# Patient Record
Sex: Female | Born: 1935 | Race: White | Hispanic: No | Marital: Single | Smoking: Never smoker
Health system: Southern US, Community
[De-identification: ages and names within clinical notes are randomized; demographics above are authoritative.]

## PROBLEM LIST (undated history)

## (undated) DIAGNOSIS — R778 Other specified abnormalities of plasma proteins: Secondary | ICD-10-CM

## (undated) DIAGNOSIS — I480 Paroxysmal atrial fibrillation: Secondary | ICD-10-CM

## (undated) DIAGNOSIS — T4145XA Adverse effect of unspecified anesthetic, initial encounter: Secondary | ICD-10-CM

## (undated) DIAGNOSIS — R7989 Other specified abnormal findings of blood chemistry: Secondary | ICD-10-CM

## (undated) DIAGNOSIS — E785 Hyperlipidemia, unspecified: Secondary | ICD-10-CM

## (undated) DIAGNOSIS — I10 Essential (primary) hypertension: Secondary | ICD-10-CM

## (undated) DIAGNOSIS — I639 Cerebral infarction, unspecified: Secondary | ICD-10-CM

## (undated) DIAGNOSIS — K5792 Diverticulitis of intestine, part unspecified, without perforation or abscess without bleeding: Secondary | ICD-10-CM

## (undated) DIAGNOSIS — I4892 Unspecified atrial flutter: Secondary | ICD-10-CM

## (undated) DIAGNOSIS — I471 Supraventricular tachycardia: Secondary | ICD-10-CM

## (undated) DIAGNOSIS — I4719 Other supraventricular tachycardia: Secondary | ICD-10-CM

## (undated) DIAGNOSIS — R739 Hyperglycemia, unspecified: Secondary | ICD-10-CM

## (undated) DIAGNOSIS — A8100 Creutzfeldt-Jakob disease, unspecified: Secondary | ICD-10-CM

## (undated) DIAGNOSIS — I2510 Atherosclerotic heart disease of native coronary artery without angina pectoris: Secondary | ICD-10-CM

## (undated) DIAGNOSIS — I959 Hypotension, unspecified: Secondary | ICD-10-CM

## (undated) DIAGNOSIS — T8859XA Other complications of anesthesia, initial encounter: Secondary | ICD-10-CM

## (undated) DIAGNOSIS — E669 Obesity, unspecified: Secondary | ICD-10-CM

## (undated) DIAGNOSIS — E039 Hypothyroidism, unspecified: Secondary | ICD-10-CM

## (undated) DIAGNOSIS — R001 Bradycardia, unspecified: Secondary | ICD-10-CM

## (undated) HISTORY — PX: ROTATOR CUFF REPAIR: SHX139

## (undated) HISTORY — DX: Essential (primary) hypertension: I10

## (undated) HISTORY — DX: Atherosclerotic heart disease of native coronary artery without angina pectoris: I25.10

## (undated) HISTORY — PX: KNEE ARTHROSCOPY: SUR90

## (undated) HISTORY — DX: Cerebral infarction, unspecified: I63.9

## (undated) HISTORY — DX: Hyperlipidemia, unspecified: E78.5

## (undated) HISTORY — PX: BREAST CYST ASPIRATION: SHX578

## (undated) HISTORY — DX: Hypothyroidism, unspecified: E03.9

## (undated) HISTORY — DX: Bradycardia, unspecified: R00.1

## (undated) HISTORY — DX: Paroxysmal atrial fibrillation: I48.0

## (undated) HISTORY — PX: GANGLION CYST EXCISION: SHX1691

---

## 2004-01-15 ENCOUNTER — Ambulatory Visit: Payer: Self-pay | Admitting: Specialist

## 2005-09-08 ENCOUNTER — Ambulatory Visit: Payer: Self-pay | Admitting: Unknown Physician Specialty

## 2005-12-01 ENCOUNTER — Ambulatory Visit: Payer: Self-pay

## 2007-09-19 ENCOUNTER — Ambulatory Visit: Payer: Self-pay | Admitting: Internal Medicine

## 2008-01-15 ENCOUNTER — Inpatient Hospital Stay: Payer: Self-pay | Admitting: Internal Medicine

## 2008-01-23 ENCOUNTER — Ambulatory Visit: Payer: Self-pay | Admitting: Specialist

## 2008-09-01 DIAGNOSIS — I639 Cerebral infarction, unspecified: Secondary | ICD-10-CM

## 2008-09-01 HISTORY — DX: Cerebral infarction, unspecified: I63.9

## 2008-09-16 ENCOUNTER — Inpatient Hospital Stay: Payer: Self-pay | Admitting: Internal Medicine

## 2008-11-04 ENCOUNTER — Ambulatory Visit: Payer: Self-pay

## 2009-11-25 ENCOUNTER — Ambulatory Visit: Payer: Self-pay

## 2011-01-04 ENCOUNTER — Ambulatory Visit: Payer: Self-pay

## 2012-01-11 ENCOUNTER — Ambulatory Visit: Payer: Self-pay

## 2012-12-24 ENCOUNTER — Ambulatory Visit: Payer: Self-pay | Admitting: Specialist

## 2013-01-16 ENCOUNTER — Ambulatory Visit: Payer: Self-pay

## 2014-01-19 ENCOUNTER — Ambulatory Visit: Payer: Self-pay | Admitting: Family Medicine

## 2014-04-03 HISTORY — PX: ABLATION: SHX5711

## 2014-07-01 ENCOUNTER — Encounter: Payer: Self-pay | Admitting: Internal Medicine

## 2014-07-29 ENCOUNTER — Encounter: Payer: Self-pay | Admitting: *Deleted

## 2014-07-29 ENCOUNTER — Ambulatory Visit (INDEPENDENT_AMBULATORY_CARE_PROVIDER_SITE_OTHER): Payer: Medicare Other | Admitting: Internal Medicine

## 2014-07-29 ENCOUNTER — Encounter: Payer: Self-pay | Admitting: Internal Medicine

## 2014-07-29 VITALS — BP 176/80 | HR 52 | Ht 65.0 in | Wt 183.0 lb

## 2014-07-29 DIAGNOSIS — I480 Paroxysmal atrial fibrillation: Secondary | ICD-10-CM | POA: Insufficient documentation

## 2014-07-29 DIAGNOSIS — I10 Essential (primary) hypertension: Secondary | ICD-10-CM | POA: Diagnosis not present

## 2014-07-29 MED ORDER — LOSARTAN POTASSIUM 25 MG PO TABS: 25.0000 mg | ORAL_TABLET | Freq: Every day | ORAL | Status: DC

## 2014-07-29 NOTE — Progress Notes (Signed)
 Electrophysiology Office Note   Date:  07/29/2014   ID:  Rhonda Graham, DOB 09/22/1934, MRN [id number]  PCP:  BABAOFF, [name], MD  Cardiologist:  Dr Paraschos Primary Electrophysiologist: Ersie Savino, MD   Referring:  Dr K Thomas ([facility])  Chief Complaint  Patient presents with  . Appointment    afib     History of Present Illness: Rhonda Graham is a 80 y.o. female who presents today for electrophysiology evaluation.   She reports initially being diagnosed with atrial fibrillation in 1999 after presenting with tachypalpitations.   She had a stroke in 2010 and continues to have residual gait imbalance.  She was placed on amiodarone which she did not tolerate due to thyroid abnormality.  She was then placed on propafenone which was initially effective.  She has since had increasing frequency and duration of atrial fibrillation.  Presently, he reports having increased frequency of afib recently.  Over the past few months, she has had 13 episodes of afib.  During her afib, she reports symptoms of palpitations with associated SOB and fatigue.  She has mild dizziness.  Episodes typically last several minutes to hours at a time.  She feels that "stress" will cause her to have afib.  She has a bipolar daughter which causes her to have stress and afib. She occasionally has palpitations in her "throat".  Today, she denies symptoms of   chest pain,  orthopnea, PND, lower extremity edema, claudication,  presyncope, syncope, bleeding, or neurologic sequela. The patient is tolerating medications without difficulties and is otherwise without complaint today.    Past Medical History  Diagnosis Date  . Paroxysmal atrial fibrillation   . Sinus bradycardia   . Hypothyroid   . Coronary artery disease     prior cath without blockages per pt  . Hyperlipidemia   . Hypertension   . Stroke 09/2008    residual balance deficit  . Overweight    Past Surgical History  Procedure Laterality Date  .  Rotator cuff repair    . Ganglion cyst excision    . Knee arthroscopy       Current Outpatient Prescriptions  Medication Sig Dispense Refill  . Calcium Carbonate-Vitamin D 600-125 MG-UNIT TABS Take 1 tablet by mouth daily.     . Cholecalciferol (VITAMIN D3) 2000 UNITS TABS Take 2,000 Int'l Units by mouth daily.    . Ibuprofen 200 MG CAPS Take 800 mg by mouth as needed (pain).     . levothyroxine (SYNTHROID, LEVOTHROID) 125 MCG tablet Take 125 mcg by mouth daily before breakfast.    . metoprolol succinate (TOPROL-XL) 25 MG 24 hr tablet Take 12.5 mg by mouth daily.    . Omega-3 Fatty Acids (OMEGA-3 FISH OIL) 300 MG CAPS Take 1 tablet by mouth daily.     . Prenat w/o A-FE-Methf-FA-Omega (PNV-OMEGA) 28-0.6-0.4-340 MG CAPS Take 1 tablet by mouth daily.     . propafenone (RYTHMOL) 225 MG tablet Take 225 mg by mouth every 8 (eight) hours.    . simvastatin (ZOCOR) 20 MG tablet Take 20 mg by mouth daily.    . warfarin (COUMADIN) 2.5 MG tablet Take 2.5 mg by mouth daily.     No current facility-administered medications for this visit.    Allergies:   Review of patient's allergies indicates no known allergies.   Social History:  The patient  reports that she has never smoked. She does not have any smokeless tobacco history on file. She reports that she   does not drink alcohol or use illicit drugs.   Family History:  The patient's  family history includes Heart disease in her father and mother; Parkinsonism in her brother; Seizures in her sister; Stroke in her father and mother.    ROS:  Please see the history of present illness.   All other systems are reviewed and negative.    PHYSICAL EXAM: VS:  BP 176/80 mmHg  Pulse 52  Ht 5' 5" (1.651 m)  Wt 183 lb (83.008 kg)  BMI 30.45 kg/m2 , BMI Body mass index is 30.45 kg/(m^2). GEN: Well nourished, well developed, in no acute distress HEENT: normal Neck: no JVD, carotid bruits, or masses Cardiac: RRR; no murmurs, rubs, or gallops,no edema    Respiratory:  clear to auscultation bilaterally, normal work of breathing GI: soft, nontender, nondistended, + BS MS: no deformity or atrophy Skin: warm and dry  Neuro:  Strength and sensation are intact Psych: euthymic mood, full affect  EKG:  EKG is ordered today. The ekg ordered today shows sinus bradycadia 52 bpm, PR 174, QRS 84, Qtc 416, poor R wave progression   Recent Labs: No results found for requested labs within last 365 days.    Lipid Panel  No results found for: CHOL, TRIG, HDL, CHOLHDL, VLDL, LDLCALC, LDLDIRECT   Wt Readings from Last 3 Encounters:  07/29/14 183 lb (83.008 kg)      Other studies Reviewed: Additional studies/ records that were reviewed today include: Dr Thomas' notes from [facility] are reviewed , Dr Paraschos notes are reviewed, prior event monitor is reviewed (65 pages are reviewed), event monitor reveals pacs, nonsustained afib/ atach Review of the above records today demonstrates: Echo 4/16- LA 39mm, EF >55%    ASSESSMENT AND PLAN:  1.  Paroxysmal atrial fibrillation The patient has symptomatic atrial fibrillation.  She has atach/ nonsustained afib on her event monitor.  She also describes a more regular tachycardia previously which could have also been an SVT or atrial flutter.  She has failed medical therapy with rhythmol and amiodarone.  Therapeutic strategies for her atrial arrhythmias including medicine and ablation were discussed in detail with the patient today. Risk, benefits, and alternatives to EP study and radiofrequency ablation for afib were also discussed in detail today. These risks include but are not limited to stroke, bleeding, vascular damage, tamponade, perforation, damage to the esophagus, lungs, and other structures, AV block requiring PPM, pulmonary vein stenosis, worsening renal function, and death. The patient understands these risk and wishes to proceed.  We will therefore proceed with catheter ablation at the next available  time.  No changes are made today. Tikosyn and flecainide could be considered as alternatives to ablation.  2. Sinus bradycardia No indication for pacing at this time.  Will follow clinically  3. htn Above goal Add losartan  4. Obesity I have reviewed the patients BMI and decreased success rates with ablation at length today.  Weight loss is strongly advised.   Regular exercise is also encouraged as per CardioFit trial data. Per Guijian et al (PACE 2013; 36: 748-756), patients with BMI 25-29.9 (obese) have a 27% increase in AF recurrence post ablation.  Patients with BMI >30 have a 31% increase in AF recurrence post ablation when compared to those with BMI <25.  Current medicines are reviewed at length with the patient today.   The patient does not have concerns regarding her medicines.  The following changes were made today:  none  Signed, Tomekia Helton, MD  07/29/2014   3:36 PM     [facility] [address] [place] [address] [phone] (office) [phone] (fax)  

## 2014-07-29 NOTE — Patient Instructions (Signed)
Medication Instructions:  Your physician has recommended you make the following change in your medication:  1) Start Losartan 25 mg daily   Labwork: Your physician recommends that you return for lab work on 08/14/14 at 10am  You do not have to be fasting  Will need to have your INR checked and faxed to Dr Johney FrameAllred next week with Dr Jasmine DecemberParascho's   Fax to [phone]   Testing/Procedures: Your physician has recommended that you have an ablation. Catheter ablation is a medical procedure used to treat some cardiac arrhythmias (irregular heartbeats). During catheter ablation, a long, thin, flexible tube is put into a blood vessel in your groin (upper thigh), or neck. This tube is called an ablation catheter. It is then guided to your heart through the blood vessel. Radio frequency waves destroy small areas of heart tissue where abnormal heartbeats may cause an arrhythmia to start. Please see the instruction sheet given to you today.  See instruction sheet for procedure      Follow-Up: Will schedule follow up at the hospital after procedure  Any Other Special Instructions Will Be Listed Below (If Applicable).

## 2014-07-30 ENCOUNTER — Other Ambulatory Visit: Payer: Self-pay | Admitting: *Deleted

## 2014-07-30 ENCOUNTER — Telehealth: Payer: Self-pay | Admitting: *Deleted

## 2014-07-30 DIAGNOSIS — I480 Paroxysmal atrial fibrillation: Secondary | ICD-10-CM

## 2014-07-30 LAB — PROTIME-INR

## 2014-07-30 NOTE — Telephone Encounter (Signed)
Called Dr. Jasmine DecemberParascho's office to inform patient needs INR checked this week and next week. They will fax results to Dr. Johney FrameAllred. Patient aware.  She will go today and next Thursday for INR check.  She will come to this office on 5/13 for further blood work.  She would like to inform that her BP last night was 109/55, HR 57 by her automatic BP cuff. She picked up her losartan this am and has concerns about taking. Advised pt to check BP prior to meds and about 3 hours later to monitor effects of losartan.

## 2014-08-03 ENCOUNTER — Telehealth: Payer: Self-pay | Admitting: Internal Medicine

## 2014-08-03 NOTE — Telephone Encounter (Signed)
New Message  Pt wanted to speak w/ Rn about TEE. Please call back and dsicuss.

## 2014-08-03 NOTE — Telephone Encounter (Signed)
Patient was concerned about how long the TEE test will take. Informed patient that it all depends, but told her maybe around an hour for the test and to include prep time and recovery time before and after. Informed patient to make sure she had someone to drive her home. Patient verbalized understanding.

## 2014-08-06 ENCOUNTER — Encounter: Payer: Self-pay | Admitting: Internal Medicine

## 2014-08-06 LAB — PROTIME-INR: INR: 3.5 — AB (ref 0.9–1.1)

## 2014-08-14 ENCOUNTER — Other Ambulatory Visit (INDEPENDENT_AMBULATORY_CARE_PROVIDER_SITE_OTHER): Payer: Medicare Other | Admitting: *Deleted

## 2014-08-14 DIAGNOSIS — I480 Paroxysmal atrial fibrillation: Secondary | ICD-10-CM | POA: Diagnosis not present

## 2014-08-14 LAB — BASIC METABOLIC PANEL
BUN: 14 mg/dL (ref 6–23)
CALCIUM: 9.2 mg/dL (ref 8.4–10.5)
CHLORIDE: 103 meq/L (ref 96–112)
CO2: 29 meq/L (ref 19–32)
Creatinine, Ser: 0.91 mg/dL (ref 0.40–1.20)
GFR: 63.54 mL/min (ref >60.00)
GLUCOSE: 112 mg/dL — AB (ref 70–99)
POTASSIUM: 3.9 meq/L (ref 3.5–5.1)
Sodium: 137 mEq/L (ref 135–145)

## 2014-08-14 LAB — CBC WITH DIFFERENTIAL/PLATELET
Basophils Absolute: 0.1 10*3/uL (ref 0.0–0.1)
Basophils Relative: 0.8 % (ref 0.0–3.0)
EOS ABS: 0.2 10*3/uL (ref 0.0–0.7)
Eosinophils Relative: 2.9 % (ref 0.0–5.0)
HCT: 39.7 % (ref 36.0–46.0)
Hemoglobin: 13.7 g/dL (ref 12.0–15.0)
LYMPHS ABS: 1.8 10*3/uL (ref 0.7–4.0)
Lymphocytes Relative: 27.9 % (ref 12.0–46.0)
MCHC: 34.5 g/dL (ref 30.0–36.0)
MCV: 84.8 fl (ref 78.0–100.0)
MONO ABS: 0.4 10*3/uL (ref 0.1–1.0)
Monocytes Relative: 6.8 % (ref 3.0–12.0)
NEUTROS ABS: 4 10*3/uL (ref 1.4–7.7)
Neutrophils Relative %: 61.6 % (ref 43.0–77.0)
PLATELETS: 262 10*3/uL (ref 150.0–400.0)
RBC: 4.68 Mil/uL (ref 3.87–5.11)
RDW: 13.8 % (ref 11.5–15.5)
WBC: 6.6 10*3/uL (ref 4.0–10.5)

## 2014-08-14 LAB — PROTIME-INR
INR: 1.5 ratio — ABNORMAL HIGH (ref 0.8–1.0)
Prothrombin Time: 16.9 s — ABNORMAL HIGH (ref 9.6–13.1)

## 2014-08-17 ENCOUNTER — Ambulatory Visit: Payer: Self-pay | Admitting: Internal Medicine

## 2014-08-19 ENCOUNTER — Telehealth: Payer: Self-pay | Admitting: Internal Medicine

## 2014-08-19 LAB — PROTIME-INR: INR: 2.5 — AB (ref 0.9–1.1)

## 2014-08-19 NOTE — Telephone Encounter (Signed)
New Message  Pt requested to speak w/ Rn about bloodwork/procedure for Friday. Per pt- was told to speak w/ Amber. Please call back and dsicuss.

## 2014-08-19 NOTE — Telephone Encounter (Signed)
Calling stating she had an INR done at Dr. Melchor AmourParachos's office this morning and wanted to know if we had gotten results.  She is scheduled for TEE tomorrow 5/19 and ablation on Fri 5/20.  Called Dr. Ian BushmanPanachos's office [phone]  3 x and left messages.  Called again at 1:55 and spoke w/Dr. Melchor AmourParachos's assistant and was informed PTT 28.0 and PT 2.5.  Spoke w/Amber who states she can proceed with both procedures.  She will order for another PT in AM.  Notified pt that both procedures can be done.  She states she has the instructions of time and place to be [facility].

## 2014-08-20 ENCOUNTER — Encounter (HOSPITAL_COMMUNITY): Payer: Self-pay | Admitting: *Deleted

## 2014-08-20 ENCOUNTER — Ambulatory Visit (HOSPITAL_COMMUNITY)
Admission: RE | Admit: 2014-08-20 | Discharge: 2014-08-20 | Disposition: A | Discharge status: Home / Self Care | Payer: Medicare Other | Source: Ambulatory Visit | Attending: Cardiology | Admitting: Cardiology

## 2014-08-20 ENCOUNTER — Encounter (HOSPITAL_COMMUNITY)
Admission: RE | Disposition: A | Discharge status: Home / Self Care | Payer: Self-pay | Source: Ambulatory Visit | Attending: Cardiology

## 2014-08-20 DIAGNOSIS — Z7901 Long term (current) use of anticoagulants: Secondary | ICD-10-CM | POA: Insufficient documentation

## 2014-08-20 DIAGNOSIS — I10 Essential (primary) hypertension: Secondary | ICD-10-CM | POA: Diagnosis not present

## 2014-08-20 DIAGNOSIS — E663 Overweight: Secondary | ICD-10-CM | POA: Diagnosis not present

## 2014-08-20 DIAGNOSIS — I340 Nonrheumatic mitral (valve) insufficiency: Secondary | ICD-10-CM | POA: Diagnosis not present

## 2014-08-20 DIAGNOSIS — Z6830 Body mass index (BMI) 30.0-30.9, adult: Secondary | ICD-10-CM | POA: Diagnosis not present

## 2014-08-20 DIAGNOSIS — I517 Cardiomegaly: Secondary | ICD-10-CM | POA: Insufficient documentation

## 2014-08-20 DIAGNOSIS — I480 Paroxysmal atrial fibrillation: Secondary | ICD-10-CM | POA: Diagnosis not present

## 2014-08-20 DIAGNOSIS — I071 Rheumatic tricuspid insufficiency: Secondary | ICD-10-CM | POA: Diagnosis not present

## 2014-08-20 DIAGNOSIS — Z8673 Personal history of transient ischemic attack (TIA), and cerebral infarction without residual deficits: Secondary | ICD-10-CM | POA: Insufficient documentation

## 2014-08-20 DIAGNOSIS — E785 Hyperlipidemia, unspecified: Secondary | ICD-10-CM | POA: Insufficient documentation

## 2014-08-20 DIAGNOSIS — I2510 Atherosclerotic heart disease of native coronary artery without angina pectoris: Secondary | ICD-10-CM | POA: Insufficient documentation

## 2014-08-20 HISTORY — PX: TEE WITHOUT CARDIOVERSION: SHX5443

## 2014-08-20 LAB — PROTIME-INR
INR: 2.47 — AB (ref 0.00–1.49)
PROTHROMBIN TIME: 27 s — AB (ref 11.6–15.2)

## 2014-08-20 SURGERY — ECHOCARDIOGRAM, TRANSESOPHAGEAL
Anesthesia: Moderate Sedation

## 2014-08-20 MED ORDER — MIDAZOLAM HCL 5 MG/ML IJ SOLN
INTRAMUSCULAR | Status: AC
  Filled 2014-08-20: qty 2

## 2014-08-20 MED ORDER — FENTANYL CITRATE (PF) 100 MCG/2ML IJ SOLN
INTRAMUSCULAR | Status: AC
  Filled 2014-08-20: qty 2

## 2014-08-20 MED ORDER — SODIUM CHLORIDE 0.9 % IV SOLN
INTRAVENOUS | Status: DC
  Administered 2014-08-20: 09:00:00 via INTRAVENOUS

## 2014-08-20 MED ORDER — BUTAMBEN-TETRACAINE-BENZOCAINE 2-2-14 % EX AERO
INHALATION_SPRAY | CUTANEOUS | Status: DC | PRN
  Administered 2014-08-20: 2 via TOPICAL

## 2014-08-20 MED ORDER — FENTANYL CITRATE (PF) 100 MCG/2ML IJ SOLN
INTRAMUSCULAR | Status: DC | PRN
  Administered 2014-08-20 (×2): 25 ug via INTRAVENOUS

## 2014-08-20 MED ORDER — MIDAZOLAM HCL 10 MG/2ML IJ SOLN
INTRAMUSCULAR | Status: DC | PRN
  Administered 2014-08-20: 2 mg via INTRAVENOUS
  Administered 2014-08-20: 1 mg via INTRAVENOUS

## 2014-08-20 NOTE — CV Procedure (Signed)
See full TEE report in [facility]; normal LV function; moderate biatrial enlargement; no LAA thrombus; mild MR and PI; trace AI; moderate TR. Rhonda MillersBrian Matina Rodier

## 2014-08-20 NOTE — Progress Notes (Signed)
Echocardiogram Echocardiogram Transesophageal has been performed.  Dorothey BasemanReel, Ahron Hulbert M 08/20/2014, 10:48 AM

## 2014-08-20 NOTE — Interval H&P Note (Signed)
History and Physical Interval Note:  08/20/2014 10:11 AM  Rhonda Graham  has presented today for surgery, with the diagnosis of A FIB  The various methods of treatment have been discussed with the patient and family. After consideration of risks, benefits and other options for treatment, the patient has consented to  Procedure(s): TRANSESOPHAGEAL ECHOCARDIOGRAM (TEE) (N/A) as a surgical intervention .  The patient's history has been reviewed, patient examined, no change in status, stable for surgery.  I have reviewed the patient's chart and labs.  Questions were answered to the patient's satisfaction.     Olga MillersBrian Ayannah Faddis

## 2014-08-20 NOTE — H&P (Signed)
Rhonda Graham  07/29/2014 3:45 PM  Office Visit  MRN:  [id number]   Description: Female DOB: 11/01/1934  Provider: Hillis RangeJames Allred, MD  Department: [facility]       Vital Signs  Most recent update: 07/29/2014 3:00 PM by Awilda BillBrandy J Carden, CMA    BP Pulse Ht Wt BMI    176/80 mmHg 52 5\' 5"  (1.651 m) 183 lb (83.008 kg) 30.45 kg/m2      Progress Notes      Hillis RangeJames Allred, MD at 07/29/2014 3:36 PM     Status: Signed       Expand All Collapse All      Electrophysiology Office Note   Date: 07/29/2014   ID: Rhonda Graham, DOB 04/08/1934, MRN [id number]  PCP: [name], [name], MD Cardiologist: Dr Darrold JunkerParaschos Primary Electrophysiologist: Hillis RangeJames Allred, MD  Referring: Dr Vincent PeyerK Thomas ([facility])  Chief Complaint  Patient presents with  . Appointment    afib    History of Present Illness: Rhonda Graham is a 80 y.o. female who presents today for electrophysiology evaluation. She reports initially being diagnosed with atrial fibrillation in 1999 after presenting with tachypalpitations.  She had a stroke in 2010 and continues to have residual gait imbalance. She was placed on amiodarone which she did not tolerate due to thyroid abnormality. She was then placed on propafenone which was initially effective. She has since had increasing frequency and duration of atrial fibrillation. Presently, he reports having increased frequency of afib recently. Over the past few months, she has had 13 episodes of afib. During her afib, she reports symptoms of palpitations with associated SOB and fatigue. She has mild dizziness. Episodes typically last several minutes to hours at a time. She feels that "stress" will cause her to have afib. She has a bipolar daughter which causes her to have stress and afib. She occasionally has palpitations in her "throat".  Today, she denies symptoms of chest pain, orthopnea, PND, lower extremity edema, claudication, presyncope,  syncope, bleeding, or neurologic sequela. The patient is tolerating medications without difficulties and is otherwise without complaint today.    Past Medical History  Diagnosis Date  . Paroxysmal atrial fibrillation   . Sinus bradycardia   . Hypothyroid   . Coronary artery disease     prior cath without blockages per pt  . Hyperlipidemia   . Hypertension   . Stroke 09/2008    residual balance deficit  . Overweight    Past Surgical History  Procedure Laterality Date  . Rotator cuff repair    . Ganglion cyst excision    . Knee arthroscopy       Current Outpatient Prescriptions  Medication Sig Dispense Refill  . Calcium Carbonate-Vitamin D 600-125 MG-UNIT TABS Take 1 tablet by mouth daily.     . Cholecalciferol (VITAMIN D3) 2000 UNITS TABS Take 2,000 Int'l Units by mouth daily.    . Ibuprofen 200 MG CAPS Take 800 mg by mouth as needed (pain).     Marland Kitchen. levothyroxine (SYNTHROID, LEVOTHROID) 125 MCG tablet Take 125 mcg by mouth daily before breakfast.    . metoprolol succinate (TOPROL-XL) 25 MG 24 hr tablet Take 12.5 mg by mouth daily.    . Omega-3 Fatty Acids (OMEGA-3 FISH OIL) 300 MG CAPS Take 1 tablet by mouth daily.     Burnis Medin. Prenat w/o A-FE-Methf-FA-Omega (PNV-OMEGA) 28-0.6-0.4-340 MG CAPS Take 1 tablet by mouth daily.     . propafenone (RYTHMOL) 225 MG tablet Take 225 mg by mouth  every 8 (eight) hours.    . simvastatin (ZOCOR) 20 MG tablet Take 20 mg by mouth daily.    Marland Kitchen warfarin (COUMADIN) 2.5 MG tablet Take 2.5 mg by mouth daily.     No current facility-administered medications for this visit.    Allergies: Review of patient's allergies indicates no known allergies.   Social History: The patient  reports that she has never smoked. She does not have any smokeless tobacco history on file. She reports that she does not drink alcohol or use illicit drugs.   Family  History: The patient's family history includes Heart disease in her father and mother; Parkinsonism in her brother; Seizures in her sister; Stroke in her father and mother.    ROS: Please see the history of present illness. All other systems are reviewed and negative.    PHYSICAL EXAM: VS: BP 176/80 mmHg  Pulse 52  Ht  (1.651 m)  Wt 183 lb (83.008 kg)  BMI 30.45 kg/m2 , BMI Body mass index is 30.45 kg/(m^2). GEN: Well nourished, well developed, in no acute distress  HEENT: normal  Neck: no JVD, carotid bruits, or masses Cardiac: RRR; no murmurs, rubs, or gallops,no edema  Respiratory: clear to auscultation bilaterally, normal work of breathing GI: soft, nontender, nondistended, + BS MS: no deformity or atrophy  Skin: warm and dry  Neuro: Strength and sensation are intact Psych: euthymic mood, full affect  EKG: EKG is ordered today. The ekg ordered today shows sinus bradycadia 52 bpm, PR 174, QRS 84, Qtc 416, poor R wave progression   Recent Labs: No results found for requested labs within last 365 days.    Lipid Panel   Labs (Brief)    No results found for: CHOL, TRIG, HDL, CHOLHDL, VLDL, LDLCALC, LDLDIRECT     Wt Readings from Last 3 Encounters:  07/29/14 183 lb (83.008 kg)      Other studies Reviewed: Additional studies/ records that were reviewed today include: Dr Maisie Fus' notes from [facility] are reviewed , Dr Darrold Junker notes are reviewed, prior event monitor is reviewed (65 pages are reviewed), event monitor reveals pacs, nonsustained afib/ atach Review of the above records today demonstrates: Echo 4/16- LA 39mm, EF >55%    ASSESSMENT AND PLAN:  1. Paroxysmal atrial fibrillation The patient has symptomatic atrial fibrillation. She has atach/ nonsustained afib on her event monitor. She also describes a more regular tachycardia previously which could have also been an SVT or atrial flutter. She has failed medical therapy with rhythmol and  amiodarone. Therapeutic strategies for her atrial arrhythmias including medicine and ablation were discussed in detail with the patient today. Risk, benefits, and alternatives to EP study and radiofrequency ablation for afib were also discussed in detail today. These risks include but are not limited to stroke, bleeding, vascular damage, tamponade, perforation, damage to the esophagus, lungs, and other structures, AV block requiring PPM, pulmonary vein stenosis, worsening renal function, and death. The patient understands these risk and wishes to proceed. We will therefore proceed with catheter ablation at the next available time. No changes are made today. Tikosyn and flecainide could be considered as alternatives to ablation.  2. Sinus bradycardia No indication for pacing at this time. Will follow clinically  3. htn Above goal Add losartan  4. Obesity I have reviewed the patients BMI and decreased success rates with ablation at length today. Weight loss is strongly advised. Regular exercise is also encouraged as per CardioFit trial data. Per Herma Carson et al (PACE 2013; 36:  [id number]), patients with BMI 25-29.9 (obese) have a 27% increase in AF recurrence post ablation. Patients with BMI >30 have a 31% increase in AF recurrence post ablation when compared to those with BMI <25.  Current medicines are reviewed at length with the patient today.  The patient does not have concerns regarding her medicines. The following changes were made today: none  Signed, Hillis RangeJames Allred, MD  07/29/2014 3:36 PM   [facility] [address] [place] [address] [phone] (office) [phone] (fax)       For TEE prior to a fib ablation; no changes. Olga MillersBrian Crenshaw

## 2014-08-21 ENCOUNTER — Encounter (HOSPITAL_COMMUNITY): Payer: Self-pay

## 2014-08-21 ENCOUNTER — Ambulatory Visit (HOSPITAL_COMMUNITY): Admit: 2014-08-21 | Payer: Self-pay | Admitting: Internal Medicine

## 2014-08-21 ENCOUNTER — Ambulatory Visit (HOSPITAL_COMMUNITY): Payer: Medicare Other | Admitting: Anesthesiology

## 2014-08-21 ENCOUNTER — Encounter (HOSPITAL_COMMUNITY)
Admission: RE | Disposition: A | Discharge status: Home / Self Care | Payer: Medicare Other | Source: Ambulatory Visit | Attending: Internal Medicine

## 2014-08-21 ENCOUNTER — Encounter (HOSPITAL_COMMUNITY): Payer: Self-pay | Admitting: Cardiology

## 2014-08-21 ENCOUNTER — Ambulatory Visit (HOSPITAL_COMMUNITY)
Admission: RE | Admit: 2014-08-21 | Discharge: 2014-08-23 | Disposition: A | Discharge status: Home / Self Care | Payer: Medicare Other | Source: Ambulatory Visit | Attending: Internal Medicine | Admitting: Internal Medicine

## 2014-08-21 DIAGNOSIS — Z6830 Body mass index (BMI) 30.0-30.9, adult: Secondary | ICD-10-CM | POA: Insufficient documentation

## 2014-08-21 DIAGNOSIS — R7989 Other specified abnormal findings of blood chemistry: Secondary | ICD-10-CM | POA: Insufficient documentation

## 2014-08-21 DIAGNOSIS — Z7901 Long term (current) use of anticoagulants: Secondary | ICD-10-CM | POA: Diagnosis not present

## 2014-08-21 DIAGNOSIS — Z8673 Personal history of transient ischemic attack (TIA), and cerebral infarction without residual deficits: Secondary | ICD-10-CM | POA: Diagnosis not present

## 2014-08-21 DIAGNOSIS — I2510 Atherosclerotic heart disease of native coronary artery without angina pectoris: Secondary | ICD-10-CM | POA: Diagnosis not present

## 2014-08-21 DIAGNOSIS — I9581 Postprocedural hypotension: Secondary | ICD-10-CM | POA: Insufficient documentation

## 2014-08-21 DIAGNOSIS — Z79899 Other long term (current) drug therapy: Secondary | ICD-10-CM | POA: Insufficient documentation

## 2014-08-21 DIAGNOSIS — E785 Hyperlipidemia, unspecified: Secondary | ICD-10-CM | POA: Diagnosis not present

## 2014-08-21 DIAGNOSIS — I10 Essential (primary) hypertension: Secondary | ICD-10-CM | POA: Insufficient documentation

## 2014-08-21 DIAGNOSIS — I959 Hypotension, unspecified: Secondary | ICD-10-CM | POA: Diagnosis not present

## 2014-08-21 DIAGNOSIS — E039 Hypothyroidism, unspecified: Secondary | ICD-10-CM | POA: Insufficient documentation

## 2014-08-21 DIAGNOSIS — E669 Obesity, unspecified: Secondary | ICD-10-CM | POA: Diagnosis not present

## 2014-08-21 DIAGNOSIS — R778 Other specified abnormalities of plasma proteins: Secondary | ICD-10-CM | POA: Insufficient documentation

## 2014-08-21 DIAGNOSIS — I484 Atypical atrial flutter: Secondary | ICD-10-CM | POA: Insufficient documentation

## 2014-08-21 DIAGNOSIS — I480 Paroxysmal atrial fibrillation: Secondary | ICD-10-CM | POA: Diagnosis not present

## 2014-08-21 DIAGNOSIS — I4891 Unspecified atrial fibrillation: Secondary | ICD-10-CM | POA: Diagnosis present

## 2014-08-21 HISTORY — DX: Hyperglycemia, unspecified: R73.9

## 2014-08-21 HISTORY — DX: Other specified abnormalities of plasma proteins: R77.8

## 2014-08-21 HISTORY — DX: Other supraventricular tachycardia: I47.19

## 2014-08-21 HISTORY — DX: Other specified abnormal findings of blood chemistry: R79.89

## 2014-08-21 HISTORY — DX: Unspecified atrial flutter: I48.92

## 2014-08-21 HISTORY — DX: Supraventricular tachycardia: I47.1

## 2014-08-21 HISTORY — PX: ELECTROPHYSIOLOGIC STUDY: SHX172A

## 2014-08-21 HISTORY — DX: Hypotension, unspecified: I95.9

## 2014-08-21 HISTORY — DX: Obesity, unspecified: E66.9

## 2014-08-21 LAB — POCT ACTIVATED CLOTTING TIME
ACTIVATED CLOTTING TIME: 190 s
Activated Clotting Time: 325 s
Activated Clotting Time: 337 s
Activated Clotting Time: 300 s
Activated Clotting Time: 313 s
Activated Clotting Time: 165 seconds

## 2014-08-21 LAB — MRSA PCR SCREENING: MRSA BY PCR: NEGATIVE

## 2014-08-21 LAB — PROTIME-INR
INR: 2.73 — AB (ref 0.00–1.49)
PROTHROMBIN TIME: 28.5 s — AB (ref 11.6–15.2)

## 2014-08-21 SURGERY — ATRIAL FIBRILLATION ABLATION
Anesthesia: General

## 2014-08-21 SURGERY — ATRIAL FIBRILLATION ABLATION
Anesthesia: Monitor Anesthesia Care

## 2014-08-21 MED ORDER — LACTATED RINGERS IV SOLN
INTRAVENOUS | Status: DC | PRN
  Administered 2014-08-21: 08:00:00 via INTRAVENOUS

## 2014-08-21 MED ORDER — MIDAZOLAM HCL 5 MG/5ML IJ SOLN
INTRAMUSCULAR | Status: DC | PRN
  Administered 2014-08-21 (×2): .5 mg via INTRAVENOUS
  Administered 2014-08-21: 1 mg via INTRAVENOUS

## 2014-08-21 MED ORDER — PHENYLEPHRINE HCL 10 MG/ML IJ SOLN
INTRAMUSCULAR | Status: DC | PRN
  Administered 2014-08-21 (×2): 40 ug via INTRAVENOUS
  Administered 2014-08-21 (×3): 80 ug via INTRAVENOUS
  Administered 2014-08-21 (×3): 40 ug via INTRAVENOUS
  Administered 2014-08-21: 80 ug via INTRAVENOUS
  Administered 2014-08-21 (×2): 40 ug via INTRAVENOUS
  Administered 2014-08-21: 120 ug via INTRAVENOUS
  Administered 2014-08-21 (×4): 40 ug via INTRAVENOUS

## 2014-08-21 MED ORDER — HEPARIN SODIUM (PORCINE) 1000 UNIT/ML IJ SOLN
INTRAMUSCULAR | Status: DC | PRN
  Administered 2014-08-21: 10000 [IU] via INTRAVENOUS

## 2014-08-21 MED ORDER — SODIUM CHLORIDE 0.9 % IJ SOLN: 3.0000 mL | INTRAMUSCULAR | Status: DC | PRN

## 2014-08-21 MED ORDER — WARFARIN SODIUM 2.5 MG PO TABS
2.5000 mg | ORAL_TABLET | Freq: Every day | ORAL | Status: DC
  Administered 2014-08-21 – 2014-08-22 (×2): 2.5 mg via ORAL
  Filled 2014-08-21 (×3): qty 1

## 2014-08-21 MED ORDER — ONDANSETRON HCL 4 MG/2ML IJ SOLN
INTRAMUSCULAR | Status: DC | PRN
  Administered 2014-08-21: 4 mg via INTRAVENOUS

## 2014-08-21 MED ORDER — IOHEXOL 350 MG/ML SOLN
INTRAVENOUS | Status: DC | PRN
  Administered 2014-08-21: 105 mL via INTRACARDIAC

## 2014-08-21 MED ORDER — ACETAMINOPHEN 325 MG PO TABS: 650.0000 mg | ORAL_TABLET | ORAL | Status: DC | PRN

## 2014-08-21 MED ORDER — LOSARTAN POTASSIUM 25 MG PO TABS
25.0000 mg | ORAL_TABLET | Freq: Every day | ORAL | Status: DC
  Administered 2014-08-21: 25 mg via ORAL
  Filled 2014-08-21 (×2): qty 1

## 2014-08-21 MED ORDER — HEPARIN SODIUM (PORCINE) 1000 UNIT/ML IJ SOLN
INTRAMUSCULAR | Status: AC
  Filled 2014-08-21: qty 1

## 2014-08-21 MED ORDER — SODIUM CHLORIDE 0.9 % IJ SOLN
3.0000 mL | Freq: Two times a day (BID) | INTRAMUSCULAR | Status: DC
  Administered 2014-08-21 – 2014-08-23 (×3): 3 mL via INTRAVENOUS

## 2014-08-21 MED ORDER — METOPROLOL TARTRATE 25 MG PO TABS
25.0000 mg | ORAL_TABLET | Freq: Once | ORAL | Status: AC
  Administered 2014-08-21: 25 mg via ORAL
  Filled 2014-08-21: qty 1

## 2014-08-21 MED ORDER — PROTAMINE SULFATE 10 MG/ML IV SOLN
INTRAVENOUS | Status: DC | PRN
  Administered 2014-08-21: 30 mg via INTRAVENOUS

## 2014-08-21 MED ORDER — ONDANSETRON HCL 4 MG/2ML IJ SOLN: 4.0000 mg | Freq: Four times a day (QID) | INTRAMUSCULAR | Status: DC | PRN

## 2014-08-21 MED ORDER — BUPIVACAINE HCL (PF) 0.25 % IJ SOLN
INTRAMUSCULAR | Status: AC
  Filled 2014-08-21: qty 30

## 2014-08-21 MED ORDER — MEPERIDINE HCL 25 MG/ML IJ SOLN: 6.2500 mg | INTRAMUSCULAR | Status: DC | PRN

## 2014-08-21 MED ORDER — BUPIVACAINE HCL (PF) 0.25 % IJ SOLN
INTRAMUSCULAR | Status: DC | PRN
Start: 2014-08-21 — End: 2014-08-21
  Administered 2014-08-21: 5 mL

## 2014-08-21 MED ORDER — ISOPROTERENOL HCL 0.2 MG/ML IJ SOLN
INTRAMUSCULAR | Status: AC
  Filled 2014-08-21: qty 5

## 2014-08-21 MED ORDER — LEVOTHYROXINE SODIUM 125 MCG PO TABS
125.0000 ug | ORAL_TABLET | Freq: Every day | ORAL | Status: DC
  Administered 2014-08-22 – 2014-08-23 (×2): 125 ug via ORAL
  Filled 2014-08-21 (×3): qty 1

## 2014-08-21 MED ORDER — PROMETHAZINE HCL 25 MG/ML IJ SOLN
6.2500 mg | INTRAMUSCULAR | Status: DC | PRN
Start: 2014-08-21 — End: 2014-08-21

## 2014-08-21 MED ORDER — PROPOFOL 10 MG/ML IV BOLUS
INTRAVENOUS | Status: DC | PRN
  Administered 2014-08-21 (×2): 20 mg via INTRAVENOUS
  Administered 2014-08-21: 30 mg via INTRAVENOUS
  Administered 2014-08-21: 10 mg via INTRAVENOUS
  Administered 2014-08-21: 20 mg via INTRAVENOUS

## 2014-08-21 MED ORDER — HEPARIN SODIUM (PORCINE) 1000 UNIT/ML IJ SOLN
INTRAMUSCULAR | Status: DC | PRN
  Administered 2014-08-21: 1000 [IU] via INTRAVENOUS

## 2014-08-21 MED ORDER — SODIUM CHLORIDE 0.9 % IV SOLN
2.0000 ug/min | INTRAVENOUS | Status: DC
  Filled 2014-08-21: qty 2

## 2014-08-21 MED ORDER — HYDROCODONE-ACETAMINOPHEN 5-325 MG PO TABS
1.0000 | ORAL_TABLET | ORAL | Status: DC | PRN
  Administered 2014-08-22: 2 via ORAL
  Filled 2014-08-21: qty 2

## 2014-08-21 MED ORDER — WARFARIN - PHARMACIST DOSING INPATIENT: Freq: Every day | Status: DC

## 2014-08-21 MED ORDER — METOPROLOL SUCCINATE 12.5 MG HALF TABLET
12.5000 mg | ORAL_TABLET | Freq: Every day | ORAL | Status: DC
  Administered 2014-08-21 – 2014-08-23 (×2): 12.5 mg via ORAL
  Filled 2014-08-21 (×3): qty 1

## 2014-08-21 MED ORDER — FENTANYL CITRATE (PF) 100 MCG/2ML IJ SOLN
INTRAMUSCULAR | Status: DC | PRN
  Administered 2014-08-21: 50 ug via INTRAVENOUS
  Administered 2014-08-21: 25 ug via INTRAVENOUS
  Administered 2014-08-21: 50 ug via INTRAVENOUS
  Administered 2014-08-21: 25 ug via INTRAVENOUS
  Administered 2014-08-21: 50 ug via INTRAVENOUS

## 2014-08-21 MED ORDER — PROPOFOL INFUSION 10 MG/ML OPTIME
INTRAVENOUS | Status: DC | PRN
  Administered 2014-08-21: 20 ug/kg/min via INTRAVENOUS

## 2014-08-21 MED ORDER — FENTANYL CITRATE (PF) 100 MCG/2ML IJ SOLN: 25.0000 ug | INTRAMUSCULAR | Status: DC | PRN

## 2014-08-21 MED ORDER — SODIUM CHLORIDE 0.9 % IV SOLN
250.0000 mL | INTRAVENOUS | Status: DC | PRN
  Administered 2014-08-22: 250 mL via INTRAVENOUS

## 2014-08-21 MED ORDER — ISOPROTERENOL HCL 0.2 MG/ML IJ SOLN
1.0000 mg | INTRAVENOUS | Status: DC | PRN
  Administered 2014-08-21: 20 ug/min via INTRAVENOUS
  Administered 2014-08-21: 20 ug via INTRAVENOUS

## 2014-08-21 SURGICAL SUPPLY — 21 items
BAG SNAP BAND KOVER 36X36 (MISCELLANEOUS) ×3 IMPLANT
BLANKET WARM UNDERBOD FULL ACC (MISCELLANEOUS) ×3 IMPLANT
CATH DIAG 6FR PIGTAIL (CATHETERS) ×3 IMPLANT
CATH NAVISTAR SMARTTOUCH DF (ABLATOR) ×3 IMPLANT
CATH SOUNDSTAR 3D IMAGING (CATHETERS) ×3 IMPLANT
CATH VARIABLE LASSO NAV 2515 (CATHETERS) ×3 IMPLANT
CATH WEBSTER BI DIR CS D-F CRV (CATHETERS) ×3 IMPLANT
COVER SWIFTLINK CONNECTOR (BAG) ×3 IMPLANT
NEEDLE TRANSEP BRK 71CM 407200 (NEEDLE) ×3 IMPLANT
PACK EP LATEX FREE (CUSTOM PROCEDURE TRAY) ×2
PACK EP LF (CUSTOM PROCEDURE TRAY) ×1 IMPLANT
PAD DEFIB LIFELINK (PAD) ×3 IMPLANT
PATCH CARTO3 (PAD) ×3 IMPLANT
SHEATH AVANTI 11F 11CM (SHEATH) ×3 IMPLANT
SHEATH PINNACLE 7F 10CM (SHEATH) ×6 IMPLANT
SHEATH PINNACLE 9F 10CM (SHEATH) ×3 IMPLANT
SHEATH SWARTZ TS SL2 63CM 8.5F (SHEATH) ×3 IMPLANT
SHIELD RADPAD SCOOP 12X17 (MISCELLANEOUS) ×3 IMPLANT
SYR MEDRAD MARK V 150ML (SYRINGE) ×3 IMPLANT
TUBING CONTRAST HIGH PRESS 48 (TUBING) ×3 IMPLANT
TUBING SMART ABLATE COOLFLOW (TUBING) ×3 IMPLANT

## 2014-08-21 NOTE — Progress Notes (Signed)
ANTICOAGULATION CONSULT NOTE - Initial Consult  Pharmacy Consult for coumadin Indication: atrial fibrillation  No Known Allergies  Patient Measurements: Height: 5\' 5"  (165.1 cm) Weight: 182 lb 12.2 oz (82.9 kg) IBW/kg (Calculated) : 57 Heparin Dosing Weight:   Vital Signs: Temp: 98.3 F (36.8 C) (05/20 1649) Temp Source: Oral (05/20 1649) BP: 183/69 mmHg (05/20 1649) Pulse Rate: 60 (05/20 1649)  Labs:  Recent Labs  08/20/14 0905 08/21/14 0647  LABPROT 27.0* 28.5*  INR 2.47* 2.73*    Estimated Creatinine Clearance: 54.2 mL/min (by C-G formula based on Cr of 0.91).   Medical History: Past Medical History  Diagnosis Date  . Paroxysmal atrial fibrillation   . Sinus bradycardia   . Hypothyroid   . Coronary artery disease     prior cath without blockages per pt  . Hyperlipidemia   . Hypertension   . Stroke 09/2008    residual balance deficit  . Overweight     Medications:  Scheduled:  . [START ON 08/22/2014] levothyroxine  125 mcg Oral QAC breakfast  . losartan  25 mg Oral Daily  . metoprolol succinate  12.5 mg Oral Daily  . sodium chloride  3 mL Intravenous Q12H  . warfarin  2.5 mg Oral q1800   Infusions:    Assessment: 80 yo female with afib is currently on therapeutic coumadin.  INR today is 2.73.  Patient was on coumadin 2.5 mg po daily; last dose was yesterday.   Goal of Therapy:  INR 2-3 Monitor platelets by anticoagulation protocol: Yes   Plan:  - continue on home coumadin 2.5 mg po daily - INR in am  Prithvi Kooi, Tsz-Yin 08/21/2014,4:53 PM

## 2014-08-21 NOTE — H&P (View-Only) (Signed)
Electrophysiology Office Note   Date:  07/29/2014   ID:  Rhonda Graham, DOB 12/01/1934, MRN [id number]  PCP:  [name], [name], MD  Cardiologist:  Dr Darrold JunkerParaschos Primary Electrophysiologist: Hillis RangeJames Rhiannan Kievit, MD   Referring:  Dr Vincent PeyerK Thomas ([facility])  Chief Complaint  Patient presents with  . Appointment    afib     History of Present Illness: Rhonda Graham is a 80 y.o. female who presents today for electrophysiology evaluation.   She reports initially being diagnosed with atrial fibrillation in 1999 after presenting with tachypalpitations.   She had a stroke in 2010 and continues to have residual gait imbalance.  She was placed on amiodarone which she did not tolerate due to thyroid abnormality.  She was then placed on propafenone which was initially effective.  She has since had increasing frequency and duration of atrial fibrillation.  Presently, he reports having increased frequency of afib recently.  Over the past few months, she has had 13 episodes of afib.  During her afib, she reports symptoms of palpitations with associated SOB and fatigue.  She has mild dizziness.  Episodes typically last several minutes to hours at a time.  She feels that "stress" will cause her to have afib.  She has a bipolar daughter which causes her to have stress and afib. She occasionally has palpitations in her "throat".  Today, she denies symptoms of   chest pain,  orthopnea, PND, lower extremity edema, claudication,  presyncope, syncope, bleeding, or neurologic sequela. The patient is tolerating medications without difficulties and is otherwise without complaint today.    Past Medical History  Diagnosis Date  . Paroxysmal atrial fibrillation   . Sinus bradycardia   . Hypothyroid   . Coronary artery disease     prior cath without blockages per pt  . Hyperlipidemia   . Hypertension   . Stroke 09/2008    residual balance deficit  . Overweight    Past Surgical History  Procedure Laterality Date  .  Rotator cuff repair    . Ganglion cyst excision    . Knee arthroscopy       Current Outpatient Prescriptions  Medication Sig Dispense Refill  . Calcium Carbonate-Vitamin D 600-125 MG-UNIT TABS Take 1 tablet by mouth daily.     . Cholecalciferol (VITAMIN D3) 2000 UNITS TABS Take 2,000 Int'l Units by mouth daily.    . Ibuprofen 200 MG CAPS Take 800 mg by mouth as needed (pain).     Marland Kitchen. levothyroxine (SYNTHROID, LEVOTHROID) 125 MCG tablet Take 125 mcg by mouth daily before breakfast.    . metoprolol succinate (TOPROL-XL) 25 MG 24 hr tablet Take 12.5 mg by mouth daily.    . Omega-3 Fatty Acids (OMEGA-3 FISH OIL) 300 MG CAPS Take 1 tablet by mouth daily.     Burnis Medin. Prenat w/o A-FE-Methf-FA-Omega (PNV-OMEGA) 28-0.6-0.4-340 MG CAPS Take 1 tablet by mouth daily.     . propafenone (RYTHMOL) 225 MG tablet Take 225 mg by mouth every 8 (eight) hours.    . simvastatin (ZOCOR) 20 MG tablet Take 20 mg by mouth daily.    Marland Kitchen. warfarin (COUMADIN) 2.5 MG tablet Take 2.5 mg by mouth daily.     No current facility-administered medications for this visit.    Allergies:   Review of patient's allergies indicates no known allergies.   Social History:  The patient  reports that she has never smoked. She does not have any smokeless tobacco history on file. She reports that she  does not drink alcohol or use illicit drugs.   Family History:  The patient's  family history includes Heart disease in her father and mother; Parkinsonism in her brother; Seizures in her sister; Stroke in her father and mother.    ROS:  Please see the history of present illness.   All other systems are reviewed and negative.    PHYSICAL EXAM: VS:  BP 176/80 mmHg  Pulse 52  Ht  (1.651 m)  Wt 183 lb (83.008 kg)  BMI 30.45 kg/m2 , BMI Body mass index is 30.45 kg/(m^2). GEN: Well nourished, well developed, in no acute distress HEENT: normal Neck: no JVD, carotid bruits, or masses Cardiac: RRR; no murmurs, rubs, or gallops,no edema    Respiratory:  clear to auscultation bilaterally, normal work of breathing GI: soft, nontender, nondistended, + BS MS: no deformity or atrophy Skin: warm and dry  Neuro:  Strength and sensation are intact Psych: euthymic mood, full affect  EKG:  EKG is ordered today. The ekg ordered today shows sinus bradycadia 52 bpm, PR 174, QRS 84, Qtc 416, poor R wave progression   Recent Labs: No results found for requested labs within last 365 days.    Lipid Panel  No results found for: CHOL, TRIG, HDL, CHOLHDL, VLDL, LDLCALC, LDLDIRECT   Wt Readings from Last 3 Encounters:  07/29/14 183 lb (83.008 kg)      Other studies Reviewed: Additional studies/ records that were reviewed today include: Dr Maisie Fus' notes from [facility] are reviewed , Dr Darrold Junker notes are reviewed, prior event monitor is reviewed (65 pages are reviewed), event monitor reveals pacs, nonsustained afib/ atach Review of the above records today demonstrates: Echo 4/16- LA 39mm, EF >55%    ASSESSMENT AND PLAN:  1.  Paroxysmal atrial fibrillation The patient has symptomatic atrial fibrillation.  She has atach/ nonsustained afib on her event monitor.  She also describes a more regular tachycardia previously which could have also been an SVT or atrial flutter.  She has failed medical therapy with rhythmol and amiodarone.  Therapeutic strategies for her atrial arrhythmias including medicine and ablation were discussed in detail with the patient today. Risk, benefits, and alternatives to EP study and radiofrequency ablation for afib were also discussed in detail today. These risks include but are not limited to stroke, bleeding, vascular damage, tamponade, perforation, damage to the esophagus, lungs, and other structures, AV block requiring PPM, pulmonary vein stenosis, worsening renal function, and death. The patient understands these risk and wishes to proceed.  We will therefore proceed with catheter ablation at the next available  time.  No changes are made today. Tikosyn and flecainide could be considered as alternatives to ablation.  2. Sinus bradycardia No indication for pacing at this time.  Will follow clinically  3. htn Above goal Add losartan  4. Obesity I have reviewed the patients BMI and decreased success rates with ablation at length today.  Weight loss is strongly advised.   Regular exercise is also encouraged as per CardioFit trial data. Per Guijian et al (PACE 2013; 36: 782-956), patients with BMI 25-29.9 (obese) have a 27% increase in AF recurrence post ablation.  Patients with BMI >30 have a 31% increase in AF recurrence post ablation when compared to those with BMI <25.  Current medicines are reviewed at length with the patient today.   The patient does not have concerns regarding her medicines.  The following changes were made today:  none  Signed, Hillis Range, MD  07/29/2014  3:36 PM     [facility] [address] [place] [address] [phone] (office) [phone] (fax)

## 2014-08-21 NOTE — Progress Notes (Signed)
Site area: RFV x 3 Site Prior to Removal:  Level 0 Pressure Applied For:28 min Manual:  yes  Patient Status During Pull:  stable Post Pull Site:  Level 0 Post Pull Instructions Given: yes  Post Pull Pulses Present: pa;pable Dressing Applied:  clear Bedrest begins @ 1330 Comments:

## 2014-08-21 NOTE — Transfer of Care (Signed)
Immediate Anesthesia Transfer of Care Note  Patient: Rhonda Graham  Procedure(s) Performed: Procedure(s): Atrial Fibrillation Ablation (N/A)  Patient Location: PACU  Anesthesia Type:General  Level of Consciousness: awake, alert , oriented and sedated  Airway & Oxygen Therapy: Patient Spontanous Breathing and Patient connected to nasal cannula oxygen  Post-op Assessment: Report given to RN, Post -op Vital signs reviewed and stable and Patient moving all extremities  Post vital signs: Reviewed and stable  Last Vitals:  Filed Vitals:   08/21/14 0538  BP: 109/79  Pulse: 51  Temp: 36.4 C  Resp: 20    Complications: No apparent anesthesia complications

## 2014-08-21 NOTE — Anesthesia Preprocedure Evaluation (Signed)
Anesthesia Evaluation  Patient identified by MRN, date of birth, ID band Patient awake    Reviewed: Allergy & Precautions, NPO status , Patient's Chart, lab work & pertinent test results, reviewed documented beta blocker date and time   Airway Mallampati: II  TM Distance: >3 FB Neck ROM: Full    Dental no notable dental hx.    Pulmonary neg pulmonary ROS,  breath sounds clear to auscultation  Pulmonary exam normal       Cardiovascular hypertension, Pt. on medications and Pt. on home beta blockers + CAD Normal cardiovascular examRhythm:Regular Rate:Normal     Neuro/Psych CVA negative psych ROS   GI/Hepatic negative GI ROS, Neg liver ROS,   Endo/Other  Hypothyroidism   Renal/GU negative Renal ROS     Musculoskeletal negative musculoskeletal ROS (+)   Abdominal   Peds  Hematology negative hematology ROS (+)   Anesthesia Other Findings   Reproductive/Obstetrics                             Anesthesia Physical Anesthesia Plan  ASA: III  Anesthesia Plan: MAC   Post-op Pain Management:    Induction: Intravenous  Airway Management Planned:   Additional Equipment: None  Intra-op Plan:   Post-operative Plan:   Informed Consent: I have reviewed the patients History and Physical, chart, labs and discussed the procedure including the risks, benefits and alternatives for the proposed anesthesia with the patient or authorized representative who has indicated his/her understanding and acceptance.   Dental advisory given  Plan Discussed with: CRNA  Anesthesia Plan Comments:         Anesthesia Quick Evaluation

## 2014-08-21 NOTE — Interval H&P Note (Signed)
History and Physical Interval Note:  08/21/2014 11:30 AM  Rhonda Graham  has presented today for surgery, with the diagnosis of afib  The various methods of treatment have been discussed with the patient and family. After consideration of risks, benefits and other options for treatment, the patient has consented to  Procedure(s): Atrial Fibrillation Ablation (N/A) as a surgical intervention .  The patient's history has been reviewed, patient examined, no change in status, stable for surgery.  I have reviewed the patient's chart and labs.  Questions were answered to the patient's satisfaction.     chads2vasc score is 6.  Hillis RangeJames Philamena Kramar

## 2014-08-21 NOTE — Anesthesia Postprocedure Evaluation (Signed)
Anesthesia Post Note  Patient: Rhonda Graham  Procedure(s) Performed: Procedure(s) (LRB): Atrial Fibrillation Ablation (N/A)  Anesthesia type: General  Patient location: PACU  Post pain: Pain level controlled  Post assessment: Post-op Vital signs reviewed  Last Vitals: BP 157/82 mmHg  Pulse 54  Temp(Src) 36.4 C  Resp 14  Ht 5' 5.5" (1.664 m)  Wt 183 lb (83.008 kg)  BMI 29.98 kg/m2  SpO2 98%  Post vital signs: Reviewed  Level of consciousness: sedated  Complications: No apparent anesthesia complications

## 2014-08-22 DIAGNOSIS — I9581 Postprocedural hypotension: Secondary | ICD-10-CM | POA: Diagnosis not present

## 2014-08-22 DIAGNOSIS — E669 Obesity, unspecified: Secondary | ICD-10-CM | POA: Diagnosis not present

## 2014-08-22 DIAGNOSIS — I480 Paroxysmal atrial fibrillation: Secondary | ICD-10-CM | POA: Diagnosis not present

## 2014-08-22 DIAGNOSIS — I209 Angina pectoris, unspecified: Secondary | ICD-10-CM | POA: Diagnosis not present

## 2014-08-22 DIAGNOSIS — I959 Hypotension, unspecified: Secondary | ICD-10-CM | POA: Diagnosis not present

## 2014-08-22 DIAGNOSIS — I484 Atypical atrial flutter: Secondary | ICD-10-CM | POA: Diagnosis not present

## 2014-08-22 DIAGNOSIS — R7989 Other specified abnormal findings of blood chemistry: Secondary | ICD-10-CM

## 2014-08-22 LAB — BASIC METABOLIC PANEL
ANION GAP: 10 (ref 5–15)
Anion gap: 11 (ref 5–15)
BUN: 9 mg/dL (ref 6–20)
BUN: 11 mg/dL (ref 6–20)
CO2: 27 mmol/L (ref 22–32)
CO2: 27 mmol/L (ref 22–32)
CREATININE: 1.01 mg/dL — AB (ref 0.44–1.00)
CREATININE: 1.14 mg/dL — AB (ref 0.44–1.00)
Calcium: 8.7 mg/dL — ABNORMAL LOW (ref 8.9–10.3)
Calcium: 8.4 mg/dL — ABNORMAL LOW (ref 8.9–10.3)
Chloride: 102 mmol/L (ref 101–111)
Chloride: 102 mmol/L (ref 101–111)
GFR calc Af Amer: >60 mL/min (ref >60)
GFR calc Af Amer: 52 mL/min — ABNORMAL LOW (ref >60)
GFR, EST NON AFRICAN AMERICAN: 52 mL/min — AB (ref >60)
GFR, EST NON AFRICAN AMERICAN: 45 mL/min — AB (ref >60)
GLUCOSE: 117 mg/dL — AB (ref 65–99)
Glucose, Bld: 143 mg/dL — ABNORMAL HIGH (ref 65–99)
Potassium: 3.8 mmol/L (ref 3.5–5.1)
Potassium: 4 mmol/L (ref 3.5–5.1)
SODIUM: 140 mmol/L (ref 135–145)
SODIUM: 139 mmol/L (ref 135–145)

## 2014-08-22 LAB — PROTIME-INR
INR: 2.95 — ABNORMAL HIGH (ref 0.00–1.49)
Prothrombin Time: 30.3 seconds — ABNORMAL HIGH (ref 11.6–15.2)

## 2014-08-22 LAB — CBC
HCT: 43 % (ref 36.0–46.0)
HEMOGLOBIN: 13.6 g/dL (ref 12.0–15.0)
MCH: 28.3 pg (ref 26.0–34.0)
MCHC: 31.6 g/dL (ref 30.0–36.0)
MCV: 89.6 fL (ref 78.0–100.0)
PLATELETS: 256 10*3/uL (ref 150–400)
RBC: 4.8 MIL/uL (ref 3.87–5.11)
RDW: 13.7 % (ref 11.5–15.5)
WBC: 11.1 10*3/uL — ABNORMAL HIGH (ref 4.0–10.5)

## 2014-08-22 LAB — T4, FREE: Free T4: 1.1 ng/dL (ref 0.61–1.12)

## 2014-08-22 LAB — TROPONIN I
Troponin I: 1.81 ng/mL (ref <0.031)
Troponin I: 3.06 ng/mL (ref <0.031)

## 2014-08-22 LAB — TSH: TSH: 2.164 u[IU]/mL (ref 0.350–4.500)

## 2014-08-22 MED ORDER — PROPAFENONE HCL 225 MG PO TABS
225.0000 mg | ORAL_TABLET | Freq: Three times a day (TID) | ORAL | Status: DC
  Administered 2014-08-22 – 2014-08-23 (×5): 225 mg via ORAL
  Filled 2014-08-22 (×8): qty 1

## 2014-08-22 MED ORDER — SODIUM CHLORIDE 0.9 % IV BOLUS (SEPSIS)
500.0000 mL | Freq: Once | INTRAVENOUS | Status: AC
  Administered 2014-08-22: 500 mL via INTRAVENOUS

## 2014-08-22 MED ORDER — DILTIAZEM LOAD VIA INFUSION
10.0000 mg | Freq: Once | INTRAVENOUS | Status: AC
  Administered 2014-08-22: 10 mg via INTRAVENOUS
  Filled 2014-08-22: qty 10

## 2014-08-22 MED ORDER — DILTIAZEM HCL 100 MG IV SOLR
5.0000 mg/h | INTRAVENOUS | Status: DC
  Administered 2014-08-22: 5 mg/h via INTRAVENOUS
  Filled 2014-08-22: qty 100

## 2014-08-22 NOTE — Progress Notes (Signed)
CRITICAL VALUE ALERT  Critical value received:  Troponin 3.06  Date of notification:  08/22/14  Time of notification: 1011  Critical value read back: yes  Nurse who received alert:  Lindajo RoyalKorey Veora Fonte, RN  MD notified (1st page): Allred  Time of first page: 1013  MD notified (2nd page): Allred  Time of second page: 1018  Responding MD: Allred  Time MD responded: 1022  *No new orders received.

## 2014-08-22 NOTE — Progress Notes (Signed)
Dr Johney FrameAllred paged and made aware of pt's continued hypotension with SBP 70-80's. No new orders received. Will continue to monitor patient closely and notify MD of any new changes or chest pain.

## 2014-08-22 NOTE — Progress Notes (Signed)
Dr. Johney FrameAllred paged to make aware of pt's continued SBP 70-80's. RN asked if scheduled Rythmol should be given. Verbal orders to still given medication. HR 60's SR. Will monitor pt closely.

## 2014-08-22 NOTE — Progress Notes (Signed)
ANTICOAGULATION CONSULT NOTE  Pharmacy Consult for coumadin Indication: atrial fibrillation  No Known Allergies  Patient Measurements: Height: 5\' 5"  (165.1 cm) Weight: 182 lb 12.2 oz (82.9 kg) IBW/kg (Calculated) : 57  Vital Signs: Temp: 98.1 F (36.7 C) (05/21 0812) Temp Source: Oral (05/21 0812) BP: 91/62 mmHg (05/21 1000) Pulse Rate: 63 (05/21 1000)  Labs:  Recent Labs  08/20/14 0905 08/21/14 0647 08/22/14 0258 08/22/14 0850  HGB  --   --   --  13.6  HCT  --   --   --  43.0  PLT  --   --   --  256  LABPROT 27.0* 28.5* 30.3*  --   INR 2.47* 2.73* 2.95*  --   CREATININE  --   --  1.01* 1.14*  TROPONINI  --   --  1.81* 3.06*    Estimated Creatinine Clearance: 43.3 mL/min (by C-G formula based on Cr of 1.14).   Medical History: Past Medical History  Diagnosis Date  . Paroxysmal atrial fibrillation   . Sinus bradycardia   . Hypothyroid   . Coronary artery disease     prior cath without blockages per pt  . Hyperlipidemia   . Hypertension   . Stroke 09/2008    residual balance deficit  . Overweight     Medications:  Scheduled:  . levothyroxine  125 mcg Oral QAC breakfast  . metoprolol succinate  12.5 mg Oral Daily  . propafenone  225 mg Oral 3 times per day  . sodium chloride  3 mL Intravenous Q12H  . warfarin  2.5 mg Oral q1800  . Warfarin - Pharmacist Dosing Inpatient   Does not apply q1800   Infusions:  . diltiazem (CARDIZEM) infusion Stopped (08/22/14 [id number])    Assessment: 80 yo female with afib continuing on pta coumadin for afib. INR remains therapeutic, 2.73>>2.95. CBC wnl. No bleed noted.  PTA dose: warfarin 2.5mg  daily  Goal of Therapy:  INR 2-3 Monitor platelets by anticoagulation protocol: Yes   Plan:  - Continue on home coumadin 2.5 mg po daily  - Daily PT/INR  - Mon s/sx bleed  Babs BertinHaley Shenicka Sunderlin, PharmD Clinical Pharmacist - Resident Pager [phone] 08/22/2014 10:50 AM

## 2014-08-22 NOTE — Progress Notes (Signed)
   SUBJECTIVE: Doing well presently though she had an eventful night.  Afib/ atypical flutter with RVR and some chest discomfort.  Though she returned to sinus with IV diltiazem, she is now hypotensive.  She is asymptomatic presently but sleepy.  Limited echo by me this am reveals no effusion.  Tn is elevated.  Marland Kitchen. levothyroxine  125 mcg Oral QAC breakfast  . metoprolol succinate  12.5 mg Oral Daily  . propafenone  225 mg Oral 3 times per day  . sodium chloride  3 mL Intravenous Q12H  . warfarin  2.5 mg Oral q1800  . Warfarin - Pharmacist Dosing Inpatient   Does not apply q1800   . diltiazem (CARDIZEM) infusion Stopped (08/22/14 0603)    OBJECTIVE: Physical Exam: Filed Vitals:   08/22/14 0630 08/22/14 0700 08/22/14 0715 08/22/14 0800  BP: 90/52 77/30 78/40  92/41  Pulse: 114 57 58 62  Temp:      TempSrc:      Resp: 15 16 17 13   Height:      Weight:      SpO2: 97% 98% 98% 94%    Intake/Output Summary (Last 24 hours) at 08/22/14 0807 Last data filed at 08/21/14 1700  Gross per 24 hour  Intake   1060 ml  Output    350 ml  Net    710 ml    Telemetry reveals afib/ atypical flutter overnight with RVR, now back in sinus 60s  GEN- The patient is tired appearing, alert and oriented x 3 today.   Head- normocephalic, atraumatic Eyes-  Sclera clear, conjunctiva pink Ears- hearing intact Oropharynx- clear Neck- supple,   Lungs- Clear to ausculation bilaterally, normal work of breathing Heart- Regular rate and rhythm, no murmurs, rubs or gallops, PMI not laterally displaced GI- soft, NT, ND, + BS Extremities- no clubbing, cyanosis, or edema, no hematoma/ bruit Skin- no rash or lesion Psych- euthymic mood, full affect Neuro- strength and sensation are intact  LABS: Basic Metabolic Panel:  Recent Labs  16/01/9604/21/16 0258  NA 140  K 3.8  CL 102  CO2 27  GLUCOSE 143*  BUN 9  CREATININE 1.01*  CALCIUM 8.7*  Cardiac Enzymes:  Recent Labs  08/22/14 0258  TROPONINI 1.81*    ekg this am reveals sinus 59 bpm, otherwise normal  ASSESSMENT AND PLAN:  Active Problems:   A-fib  1. Afib Pt with ERAF post ablation  Now back in sinus Will restart propafenone Continue toprol Continue anticoagulation  2. Hypotension Likely due to AF with RVR and IV dilt No effusion this am on limited echo by me Now improving with 250ml IV NS bolus Would avoid additional hydration given current volume status (received 1.5L IV fluid in EP lab yesterday)  3. Chest pain +Tn CP is resolved +Tn could have been due to demand ischemic with RVR but also could have been due to substantial ablation/ damage to myocytes in the EP lab Will trend Would consider outpatient stress test  Will reassess later this am Probable discharge tomorrow if no new events occur  Will need follow-up this week in the AF clinic    Hillis RangeJames Swayzie Choate, MD 08/22/2014 8:07 AM

## 2014-08-22 NOTE — Progress Notes (Signed)
Paged md twice about positive troponin level of 1.81, no response at this time. Will inform day rn. Patient is sleeping now and chest pain is 'tolerable' per patient.

## 2014-08-23 ENCOUNTER — Encounter (HOSPITAL_COMMUNITY): Payer: Self-pay | Admitting: Physician Assistant

## 2014-08-23 ENCOUNTER — Other Ambulatory Visit: Payer: Self-pay | Admitting: Physician Assistant

## 2014-08-23 DIAGNOSIS — R7989 Other specified abnormal findings of blood chemistry: Secondary | ICD-10-CM | POA: Diagnosis not present

## 2014-08-23 DIAGNOSIS — R778 Other specified abnormalities of plasma proteins: Secondary | ICD-10-CM | POA: Insufficient documentation

## 2014-08-23 DIAGNOSIS — I9581 Postprocedural hypotension: Secondary | ICD-10-CM | POA: Insufficient documentation

## 2014-08-23 DIAGNOSIS — I480 Paroxysmal atrial fibrillation: Secondary | ICD-10-CM | POA: Diagnosis not present

## 2014-08-23 LAB — BASIC METABOLIC PANEL
ANION GAP: 10 (ref 5–15)
BUN: 14 mg/dL (ref 6–20)
CALCIUM: 8.6 mg/dL — AB (ref 8.9–10.3)
CHLORIDE: 102 mmol/L (ref 101–111)
CO2: 25 mmol/L (ref 22–32)
Creatinine, Ser: 1.22 mg/dL — ABNORMAL HIGH (ref 0.44–1.00)
GFR calc Af Amer: 48 mL/min — ABNORMAL LOW (ref >60)
GFR calc non Af Amer: 41 mL/min — ABNORMAL LOW (ref >60)
Glucose, Bld: 158 mg/dL — ABNORMAL HIGH (ref 65–99)
Potassium: 3.7 mmol/L (ref 3.5–5.1)
SODIUM: 137 mmol/L (ref 135–145)

## 2014-08-23 LAB — GLUCOSE, CAPILLARY: GLUCOSE-CAPILLARY: 129 mg/dL — AB (ref 65–99)

## 2014-08-23 LAB — CBC
HEMATOCRIT: 38 % (ref 36.0–46.0)
Hemoglobin: 12.5 g/dL (ref 12.0–15.0)
MCH: 29.3 pg (ref 26.0–34.0)
MCHC: 32.9 g/dL (ref 30.0–36.0)
MCV: 89.2 fL (ref 78.0–100.0)
Platelets: 202 10*3/uL (ref 150–400)
RBC: 4.26 MIL/uL (ref 3.87–5.11)
RDW: 13.7 % (ref 11.5–15.5)
WBC: 7.7 10*3/uL (ref 4.0–10.5)

## 2014-08-23 LAB — PROTIME-INR
INR: 3.18 — ABNORMAL HIGH (ref 0.00–1.49)
PROTHROMBIN TIME: 32 s — AB (ref 11.6–15.2)

## 2014-08-23 MED ORDER — WARFARIN SODIUM 2.5 MG PO TABS: 2.5000 mg | ORAL_TABLET | Freq: Every day | ORAL | Status: DC

## 2014-08-23 MED ORDER — OMEPRAZOLE 20 MG PO CPDR: 20.0000 mg | DELAYED_RELEASE_CAPSULE | Freq: Every day | ORAL | Status: DC

## 2014-08-23 MED ORDER — WARFARIN SODIUM 1 MG PO TABS
1.0000 mg | ORAL_TABLET | Freq: Once | ORAL | Status: DC
  Filled 2014-08-23: qty 1

## 2014-08-23 NOTE — Discharge Summary (Addendum)
Discharge Summary   Patient ID: Rhonda Graham MRN: [id number], DOB/AGE: 1934/11/11 80 y.o. Admit date: 08/21/2014 D/C date:     08/23/2014  Primary Care Provider: Rozanna Box, MD Primary Cardiologist: Annistyn Depass  Primary Discharge Diagnoses:  1. Paroxysmal atrial fibrillation s/p ablation 2. Hypotension 3. Chest pain/positive troponin 4. Obesity Body mass index is 30.41 kg/(m^2). 5. Multiple atypical atrial flutter circuits, too unstable for mapping/ ablation by EP study 08/21/14 6. Atach identified on event monitoring per MD office note 7. Mildly elevated creatinine 8. Hyperglycemia  Secondary Discharge Diagnoses:  1. H/o sinus bradycardia 2. Hypothyroid 3. HLD 4. H/o stroke  Hospital Course: Rhonda Graham is a 80 y/o F with history of PAF, stroke, HTN, HLD, prior cath without blockages per patient who presented to [facility] for planned ablation. Per notes she was diagnosed with atrial fibrillation in 1999 after presenting with tachypalpitations. She had a stroke in 2010 and continues to have residual gait imbalance. She was placed on amiodarone which she did not tolerate due to thyroid abnormality. She was then placed on propafenone which was initially effective. She has since had increasing frequency and duration of atrial fibrillation.Over the past few months, she has had 13 episodes of afib. During her afib, she reports symptoms of palpitations with associated SOB, fatigue and dizziness. Episodes typically last several minutes to hours at a time.She feels that "stress" will cause her to have afib. She has a bipolar daughter which causes her to have stress and afib. She denied any chest pain. She has had atach/ nonsustained afib on her event monitor. Dr. Johney Frame discussed options with her and ultimately the patient elected to proceed with ablation. Risks, benefits, and alternatives to catheter ablation of atrial fibrillation were reviewed with the patient who wished to  proceed. The patient underwent TEE prior to the procedure which demonstrated normal LV function and no LAA thrombus.She was brought in 08/21/14 and underwent EPS, ablation of atrial fibrillation. She was also found to have multiple atypical atrial flutter circuits, too unstable for mapping/ ablation that day. Post-procedure she reports chest pain coinciding with recurrence of atrial fib/atypical atrial flutter overnight with RVR with peak troponin of 1.8. She was treated with IV diltiazem and converted to NSR but remained hypotensive afterwards. She was given IV fluids. Limited echo x2 revealed no effusion. Dr. Johney Frame restarted propafenone and cautiously monitored the patient for another day. Today her blood pressure has improved and labwork is stable. Today she is feeling well. Dr. Johney Frame has seen and examined the patient today and feels he is stable for discharge. He recommended for her to hold Coumadin tonight, normal dose tomorrow, and keep INR check at [facility] as previously arranged on Tuesday. He would like for her to see Rudi Coco NP in the AF clinic on Tuesday 5/24. He also wants her to come back in 1 month and 3 months for followup. It appears [employer] has already scheduled these appts and left information on post-ablation care. PPI was added for 6 weeks.  Issues for follow-up: - Creatinine has risen slightly from 1.01->1.22. May consider repeating in followup. Cozaar remains on hold.  - With regard to elevated troponin, Dr. Johney Frame feels this may have been due to demand ischemic with RVR but also could have been due to substantial ablation/ damage to myocytes in the EP lab. Consider arranging outpatient stress test. If she is stable at follow-up visit later this week, please arrange. - Glucose was moderately elevated at  times. If labs are rechecked consider adding A1C. Do not see recent result in EPIC.   Discharge Vitals: Blood pressure 123/47, pulse 60, temperature 98.8 F (37.1  C), temperature source Oral, resp. rate 18, height 5\' 5"  (1.651 m), weight 182 lb 12.2 oz (82.9 kg), SpO2 97 %.  Labs: Lab Results  Component Value Date   WBC 7.7 08/23/2014   HGB 12.5 08/23/2014   HCT 38.0 08/23/2014   MCV 89.2 08/23/2014   PLT 202 08/23/2014    Recent Labs Lab 08/23/14 1005  NA 137  K 3.7  CL 102  CO2 25  BUN 14  CREATININE 1.22*  CALCIUM 8.6*  GLUCOSE 158*    Recent Labs  08/22/14 0258 08/22/14 0850  TROPONINI 1.81* 3.06*    Diagnostic Studies/Procedures   See EP Study.  Discharge Medications   Current Discharge Medication List    START taking these medications   Details  omeprazole (PRILOSEC) 20 MG capsule Take 1 capsule (20 mg total) by mouth daily. For 6 weeks. Qty: 42 capsule, Refills: 0      CONTINUE these medications which have CHANGED   Details  warfarin (COUMADIN) 2.5 MG tablet Take 1 tablet (2.5 mg total) by mouth daily at 6 PM. NO COUMADIN TONIGHT. Resume regular home dose tomorrow. 08/24/14      CONTINUE these medications which have NOT CHANGED   Details  Calcium Carbonate-Vitamin D 600-125 MG-UNIT TABS Take 1 tablet by mouth daily at 6 PM.     Cholecalciferol (VITAMIN D3) 2000 UNITS TABS Take 2,000 Units by mouth daily.     levothyroxine (SYNTHROID, LEVOTHROID) 125 MCG tablet Take 125 mcg by mouth daily before breakfast.    metoprolol succinate (TOPROL-XL) 25 MG 24 hr tablet Take 12.5 mg by mouth daily.    Omega-3 Fatty Acids (OMEGA-3 FISH OIL) 300 MG CAPS Take 1 tablet by mouth daily at 6 PM.     Prenat w/o A-FE-Methf-FA-Omega (PNV-OMEGA) 28-0.6-0.4-340 MG CAPS Take 1 tablet by mouth daily at 6 PM.     propafenone (RYTHMOL) 225 MG tablet Take 225 mg by mouth 3 (three) times daily.     simvastatin (ZOCOR) 20 MG tablet Take 20 mg by mouth daily at 6 PM.       STOP taking these medications     losartan (COZAAR) 25 MG tablet      ibuprofen (ADVIL,MOTRIN) 800 MG tablet         Disposition   The patient will be  discharged in stable condition to home. Discharge Instructions    Diet - low sodium heart healthy    Complete by:  As directed      Increase activity slowly    Complete by:  As directed   NO COUMADIN TONIGHT. Resume regular home dose tomorrow.          Follow-up Information    Follow up with Hillis RangeJames Alaisa Moffitt, MD On 11/23/2014.   Specialty:  Cardiology   Why:  at 3:15PM   Contact information:   [address] [place] [address] [phone]       Follow [facility] with [facility] On 09/18/2014.   Why:  at [employer]10AM   Contact information:   [address] [place] [phone]      Follow up with [facility] On 08/25/2014.   Why:  at [employer]9AM   Contact information:   [address] [place] [phone]      Follow  up with [facility] On 08/25/2014.   Why:  Please keep your Coumadin Clinic visit on Tuesday 08/25/14 as planned.        Duration of Discharge Encounter: Greater than 30 minutes including physician and PA time.  Signed, Kriste Basque, Dunn PA-C 08/23/2014, 2:43 PM     I have seen, examined the patient, and reviewed the above assessment and plan.  Changes to above are made where necessary.    Co Sign: Hillis Range, MD 08/23/2014 4:30 PM

## 2014-08-23 NOTE — Progress Notes (Addendum)
   SUBJECTIVE: No further afib.  Denies CP or SOB.  Resting comfortably.  She did well up to chair yesterday.   . levothyroxine  125 mcg Oral QAC breakfast  . metoprolol succinate  12.5 mg Oral Daily  . propafenone  225 mg Oral 3 times per day  . sodium chloride  3 mL Intravenous Q12H  . warfarin  2.5 mg Oral q1800  . Warfarin - Pharmacist Dosing Inpatient   Does not apply q1800   . diltiazem (CARDIZEM) infusion Stopped (08/22/14 0603)    OBJECTIVE: Physical Exam: Filed Vitals:   08/22/14 2300 08/23/14 0000 08/23/14 0357 08/23/14 0400  BP: 124/48 75/42 122/60 122/60  Pulse: 64 60 68 67  Temp: 100 F (37.8 C)  98.4 F (36.9 C)   TempSrc: Oral  Oral   Resp: 10 17 21 15   Height:      Weight:      SpO2: 98% 87% 99% 94%    Intake/Output Summary (Last 24 hours) at 08/23/14 0745 Last data filed at 08/22/14 2224  Gross per 24 hour  Intake    490 ml  Output    375 ml  Net    115 ml    Telemetry reveals sinus 60s  GEN- The patient is sleeping but rouses, oriented x 3 today.    Head- normocephalic, atraumatic Eyes-  Sclera clear, conjunctiva pink Ears- hearing intact Oropharynx- clear Neck- supple,   Lungs- Clear to ausculation bilaterally, normal work of breathing Heart- Regular rate and rhythm, no murmurs, rubs or gallops, PMI not laterally displaced GI- soft, NT, ND, + BS Extremities- no clubbing, cyanosis, or edema, no hematoma/ bruit Skin- no rash or lesion Psych- euthymic mood, full affect Neuro- strength and sensation are intact  LABS: Basic Metabolic Panel:  Recent Labs  96/29/5205/21/16 0258 08/22/14 0850  NA 140 139  K 3.8 4.0  CL 102 102  CO2 27 27  GLUCOSE 143* 117*  BUN 9 11  CREATININE 1.01* 1.14*  CALCIUM 8.7* 8.4*  Cardiac Enzymes:  Recent Labs  08/22/14 0258 08/22/14 0850  TROPONINI 1.81* 3.06*    ASSESSMENT AND PLAN:  Active Problems:   A-fib  1. Afib Continue propafenone Continue toprol Goal INR 2-3  2. Hypotension Slowly  improving Repeat bmet, cbc today Up to chair today Limited echo twice yesterday reveals no effusion  3. Chest pain +Tn Modest troponin elevation post ablation.  + Tn could be due to ablation procedure itself utpatient stress test  Will reassess later this am  Will need follow-up this week in the AF clinic    Hillis RangeJames Travon Crochet, MD 08/23/2014 7:45 AM   Addendum:  Doing very well.  BP improved.  Labs look good. OK to discharge to home.  Would give no coumadin tonight.  Resume normal dose tomorrow. She [facility]. I would like for her to see Rudi Cocoonna Carroll NP in the AF clinic on Wed.  Resume propafenone and toprol at discharge Add PPI x 6 weeks.  Follow-up with DC next week and also in 1 month I will see in 3 months unless new problems arise.  Hillis RangeJames Geniyah Eischeid MD, [facility] 08/23/2014 1:22 PM

## 2014-08-23 NOTE — Discharge Instructions (Signed)
No driving for 5 days. No lifting over 5 lbs for 1 week. No sexual activity for 1 week. Marland Kitchen. Keep procedure site clean & dry. If you notice increased pain, swelling, bleeding or pus, call/return!  You may shower, but no soaking baths/hot tubs/pools for 1 week.   Dr. Johney FrameAllred wants you to take a proton pump inhibitor medication for 6 weeks. We have prescribed omeprazole.  We stopped your losartan due to low blood pressure this admission.  Patients taking Coumadin should generally stay away from medicines like ibuprofen, Advil, Motrin, naproxen, and Aleve due to risk of stomach bleeding. You may take Tylenol as directed or talk to your primary doctor about alternatives.

## 2014-08-23 NOTE — Progress Notes (Signed)
ANTICOAGULATION CONSULT NOTE  Pharmacy Consult for coumadin Indication: atrial fibrillation  No Known Allergies  Patient Measurements: Height: 5\' 5"  (165.1 cm) Weight: 182 lb 12.2 oz (82.9 kg) IBW/kg (Calculated) : 57  Vital Signs: Temp: 99 F (37.2 C) (05/22 0814) Temp Source: Oral (05/22 0814) BP: 118/43 mmHg (05/22 1028) Pulse Rate: 60 (05/22 0700)  Labs:  Recent Labs  08/21/14 0647 08/22/14 0258 08/22/14 0850 08/23/14 0345 08/23/14 1005  HGB  --   --  13.6  --  12.5  HCT  --   --  43.0  --  38.0  PLT  --   --  256  --  202  LABPROT 28.5* 30.3*  --  32.0*  --   INR 2.73* 2.95*  --  3.18*  --   CREATININE  --  1.01* 1.14*  --   --   TROPONINI  --  1.81* 3.06*  --   --     Estimated Creatinine Clearance: 43.3 mL/min (by C-G formula based on Cr of 1.14).   Medical History: Past Medical History  Diagnosis Date  . Paroxysmal atrial fibrillation   . Sinus bradycardia   . Hypothyroid   . Coronary artery disease     prior cath without blockages per pt  . Hyperlipidemia   . Hypertension   . Stroke 09/2008    residual balance deficit  . Overweight     Medications:  Scheduled:  . levothyroxine  125 mcg Oral QAC breakfast  . metoprolol succinate  12.5 mg Oral Daily  . propafenone  225 mg Oral 3 times per day  . sodium chloride  3 mL Intravenous Q12H  . warfarin  1 mg Oral ONCE-1800  . Warfarin - Pharmacist Dosing Inpatient   Does not apply q1800   Infusions:  . diltiazem (CARDIZEM) infusion Stopped (08/22/14 16100603)    Assessment: 80 yo female with afib continuing on pta coumadin for afib. INR now very slightly supratherapeutic, 2.95>>3.18. CBC wnl. No bleed noted. CBC stable wnl  PTA dose: warfarin 2.5mg  daily  Goal of Therapy:  INR 2-3 Monitor platelets by anticoagulation protocol: Yes   Plan:  - Give lower dose warfarin 1mg  x 1 tonight. D/c home warfarin dose for now.  - Daily PT/INR  - Mon s/sx bleed  Babs BertinHaley Amara Justen, PharmD Clinical Pharmacist -  Resident Pager [phone] 08/23/2014 10:37 AM

## 2014-08-25 ENCOUNTER — Encounter (HOSPITAL_COMMUNITY): Payer: Medicare Other | Admitting: Nurse Practitioner

## 2014-09-18 ENCOUNTER — Encounter (HOSPITAL_COMMUNITY): Payer: Medicare Other | Admitting: Nurse Practitioner

## 2014-10-26 ENCOUNTER — Encounter (HOSPITAL_COMMUNITY): Payer: Self-pay

## 2014-11-23 ENCOUNTER — Other Ambulatory Visit: Payer: Self-pay

## 2014-11-23 ENCOUNTER — Ambulatory Visit (INDEPENDENT_AMBULATORY_CARE_PROVIDER_SITE_OTHER): Payer: Medicare Other | Admitting: Internal Medicine

## 2014-11-23 ENCOUNTER — Encounter: Payer: Self-pay | Admitting: Internal Medicine

## 2014-11-23 VITALS — BP 148/84 | HR 53 | Ht 66.5 in | Wt 173.4 lb

## 2014-11-23 DIAGNOSIS — I480 Paroxysmal atrial fibrillation: Secondary | ICD-10-CM | POA: Diagnosis not present

## 2014-11-23 NOTE — Progress Notes (Signed)
PCP: BABAOFF, Lavada Mesi, MD Primary Cardiologist:  Dr Inocente Salles is a 80 y.o. female who presents today for routine electrophysiology followup.  Since her recent AF ablation, the patient reports doing very well.  She had some ERAF but has had no recent af.  She had transient post operative hypotension (likely due to anesthesia) but no other postprocedure complications.  Today, she denies symptoms of palpitations, chest pain, shortness of breath,  lower extremity edema, dizziness, presyncope, or syncope.  The patient is otherwise without complaint today.   Past Medical History  Diagnosis Date  . Paroxysmal atrial fibrillation   . Sinus bradycardia   . Hypothyroid   . Coronary artery disease     prior cath without blockages per pt  . Hyperlipidemia   . Hypertension   . Stroke 09/2008    residual balance deficit  . Hypotension   . Elevated troponin     a. 08/2014 post AF ablation.  . Obesity   . Atrial flutter     a. Multiple atypical atrial flutter circuits, too unstable for mapping/ ablation by EP study 08/21/14.  . Atrial tachycardia     a. Atach identified on event monitoring per Dr. Jenel Lucks note.  . Hyperglycemia   . Elevated serum creatinine     a. Per labs 08/2014.   Past Surgical History  Procedure Laterality Date  . Rotator cuff repair    . Ganglion cyst excision    . Knee arthroscopy    . Tee without cardioversion N/A 08/20/2014    Procedure: TRANSESOPHAGEAL ECHOCARDIOGRAM (TEE);  Surgeon: Lewayne Bunting, MD;  Location: [facility] ENDOSCOPY;  Service: Cardiovascular;  Laterality: N/A;  . Electrophysiologic study N/A 08/21/2014    Procedure: Atrial Fibrillation Ablation;  Surgeon: Hillis Range, MD;  Location: [facility] INVASIVE CV LAB;  Service: Cardiovascular;  Laterality: N/A;    ROS- all systems are reviewed and negatives except as per HPI above  Current Outpatient Prescriptions  Medication Sig Dispense Refill  . Calcium Carbonate-Vitamin D 600-125 MG-UNIT TABS Take 1  tablet by mouth daily at 6 PM.     . Cholecalciferol (VITAMIN D3) 2000 UNITS TABS Take 2,000 Units by mouth daily.     Marland Kitchen levothyroxine (SYNTHROID, LEVOTHROID) 125 MCG tablet Take 125 mcg by mouth daily before breakfast.    . metoprolol tartrate (LOPRESSOR) 25 MG tablet Take 0.5 mg by mouth daily.    . Omega-3 Fatty Acids (OMEGA-3 FISH OIL) 300 MG CAPS Take 1 tablet by mouth daily at 6 PM.     . Prenat w/o A-FE-Methf-FA-Omega (PNV-OMEGA) 28-0.6-0.4-340 MG CAPS Take 1 tablet by mouth daily at 6 PM.     . propafenone (RYTHMOL) 225 MG tablet Take 225 mg by mouth 3 (three) times daily.     . simvastatin (ZOCOR) 20 MG tablet Take 20 mg by mouth daily at 6 PM.     . warfarin (COUMADIN) 2.5 MG tablet Take 1 tablet (2.5 mg total) by mouth daily at 6 PM.     No current facility-administered medications for this visit.    Physical Exam: Filed Vitals:   11/23/14 1514  BP: 148/84  Pulse: 53  Height: 5' 6.5" (1.689 m)  Weight: 78.654 kg (173 lb 6.4 oz)    GEN- The patient is well appearing, alert and oriented x 3 today.   Head- normocephalic, atraumatic Eyes-  Sclera clear, conjunctiva pink Ears- hearing intact Oropharynx- clear Lungs- Clear to ausculation bilaterally, normal work of breathing Heart- Regular rate and  rhythm, no murmurs, rubs or gallops, PMI not laterally displaced GI- soft, NT, ND, + BS Extremities- no clubbing, cyanosis, or edema  ekg today reveals sinus bradycardia 53 bpm, septal infarct pattern, otherwise normal ekg  Assessment and Plan:  1. Paroxysmal atrial fibrillation Doing well s/p ablation Stop propafenone today Consider stopping metoprolol upon return to see Lupita Leash Continue life long anticoagulation  Follow-up with Lupita Leash in the AF clinic every 3 months I will see when needed going forward

## 2014-11-23 NOTE — Patient Instructions (Signed)
Medication Instructions:  Your physician has recommended you make the following change in your medication:  1) Stop Rythmol   Labwork: None ordered  Testing/Procedures: None ordered  Follow-Up: Your physician recommends that you schedule a follow-up appointment in: 3 months with Rudi Coco, NP   Any Other Special Instructions Will Be Listed Below (If Applicable).

## 2014-11-25 ENCOUNTER — Telehealth: Payer: Self-pay | Admitting: Internal Medicine

## 2014-11-25 NOTE — Telephone Encounter (Signed)
New message      Pt says she needs to go back on her rhythmol.  She think she is in afib.  She want to pick up her refill tomorrow and take 2 pills a day starting out.  Is this ok?

## 2014-11-25 NOTE — Telephone Encounter (Signed)
Needs to be seen in afib clinic tomorrow of Fri to confirm afib per Dr Johney Frame before starting medication back

## 2014-11-26 NOTE — Telephone Encounter (Signed)
Melissa spoke with patient to get in into afib clinic and she wants to keep things as they are.  She is in NSR at present and feels okay

## 2015-01-11 ENCOUNTER — Other Ambulatory Visit: Payer: Self-pay | Admitting: Family Medicine

## 2015-01-11 DIAGNOSIS — Z1231 Encounter for screening mammogram for malignant neoplasm of breast: Secondary | ICD-10-CM

## 2015-01-21 ENCOUNTER — Other Ambulatory Visit: Payer: Self-pay | Admitting: Family Medicine

## 2015-01-21 ENCOUNTER — Ambulatory Visit
Admission: RE | Admit: 2015-01-21 | Discharge: 2015-01-21 | Disposition: A | Discharge status: Home / Self Care | Payer: Medicare Other | Source: Ambulatory Visit | Attending: Family Medicine | Admitting: Family Medicine

## 2015-01-21 DIAGNOSIS — Z1231 Encounter for screening mammogram for malignant neoplasm of breast: Secondary | ICD-10-CM | POA: Diagnosis present

## 2015-01-25 ENCOUNTER — Other Ambulatory Visit: Payer: Self-pay | Admitting: Family Medicine

## 2015-01-25 DIAGNOSIS — N63 Unspecified lump in unspecified breast: Secondary | ICD-10-CM

## 2015-02-01 ENCOUNTER — Ambulatory Visit
Admission: RE | Admit: 2015-02-01 | Discharge: 2015-02-01 | Disposition: A | Discharge status: Home / Self Care | Payer: Medicare Other | Source: Ambulatory Visit | Attending: Family Medicine | Admitting: Family Medicine

## 2015-02-01 DIAGNOSIS — N63 Unspecified lump in unspecified breast: Secondary | ICD-10-CM

## 2015-02-01 DIAGNOSIS — R928 Other abnormal and inconclusive findings on diagnostic imaging of breast: Secondary | ICD-10-CM | POA: Diagnosis not present

## 2015-02-23 ENCOUNTER — Inpatient Hospital Stay (HOSPITAL_COMMUNITY): Admission: RE | Admit: 2015-02-23 | Payer: Medicare Other | Source: Ambulatory Visit | Admitting: Nurse Practitioner

## 2015-06-08 ENCOUNTER — Emergency Department (HOSPITAL_COMMUNITY)
Admission: EM | Admit: 2015-06-08 | Discharge: 2015-06-08 | Discharge status: Against Medical Advice | Payer: Medicare Other

## 2015-06-08 NOTE — ED Notes (Signed)
Pt's daughter was able to get an appt with pt's PCP. So pt is leaving without being seen by EDP to be evaluated by her PCP

## 2015-07-14 ENCOUNTER — Encounter (HOSPITAL_COMMUNITY): Payer: Self-pay | Admitting: Emergency Medicine

## 2015-07-14 ENCOUNTER — Inpatient Hospital Stay (HOSPITAL_COMMUNITY)
Admission: EM | Admit: 2015-07-14 | Discharge: 2015-07-20 | DRG: 309 | Disposition: A | Discharge status: Skilled Nursing Facility | Payer: Medicare HMO | Attending: Internal Medicine | Admitting: Internal Medicine

## 2015-07-14 DIAGNOSIS — G9340 Encephalopathy, unspecified: Secondary | ICD-10-CM | POA: Insufficient documentation

## 2015-07-14 DIAGNOSIS — I959 Hypotension, unspecified: Secondary | ICD-10-CM | POA: Diagnosis present

## 2015-07-14 DIAGNOSIS — E039 Hypothyroidism, unspecified: Secondary | ICD-10-CM | POA: Diagnosis present

## 2015-07-14 DIAGNOSIS — A8100 Creutzfeldt-Jakob disease, unspecified: Secondary | ICD-10-CM | POA: Diagnosis present

## 2015-07-14 DIAGNOSIS — I4891 Unspecified atrial fibrillation: Secondary | ICD-10-CM | POA: Diagnosis present

## 2015-07-14 DIAGNOSIS — E0580 Other thyrotoxicosis without thyrotoxic crisis or storm: Secondary | ICD-10-CM | POA: Diagnosis present

## 2015-07-14 DIAGNOSIS — Z66 Do not resuscitate: Secondary | ICD-10-CM | POA: Diagnosis present

## 2015-07-14 DIAGNOSIS — R1084 Generalized abdominal pain: Secondary | ICD-10-CM

## 2015-07-14 DIAGNOSIS — E669 Obesity, unspecified: Secondary | ICD-10-CM | POA: Diagnosis present

## 2015-07-14 DIAGNOSIS — I129 Hypertensive chronic kidney disease with stage 1 through stage 4 chronic kidney disease, or unspecified chronic kidney disease: Secondary | ICD-10-CM | POA: Diagnosis present

## 2015-07-14 DIAGNOSIS — I2510 Atherosclerotic heart disease of native coronary artery without angina pectoris: Secondary | ICD-10-CM | POA: Diagnosis present

## 2015-07-14 DIAGNOSIS — R32 Unspecified urinary incontinence: Secondary | ICD-10-CM | POA: Diagnosis present

## 2015-07-14 DIAGNOSIS — E860 Dehydration: Secondary | ICD-10-CM | POA: Diagnosis present

## 2015-07-14 DIAGNOSIS — Z823 Family history of stroke: Secondary | ICD-10-CM

## 2015-07-14 DIAGNOSIS — K5792 Diverticulitis of intestine, part unspecified, without perforation or abscess without bleeding: Secondary | ICD-10-CM | POA: Diagnosis present

## 2015-07-14 DIAGNOSIS — N183 Chronic kidney disease, stage 3 unspecified: Secondary | ICD-10-CM | POA: Diagnosis present

## 2015-07-14 DIAGNOSIS — Z79899 Other long term (current) drug therapy: Secondary | ICD-10-CM

## 2015-07-14 DIAGNOSIS — E0590 Thyrotoxicosis, unspecified without thyrotoxic crisis or storm: Secondary | ICD-10-CM | POA: Diagnosis present

## 2015-07-14 DIAGNOSIS — K5732 Diverticulitis of large intestine without perforation or abscess without bleeding: Secondary | ICD-10-CM | POA: Diagnosis present

## 2015-07-14 DIAGNOSIS — D72829 Elevated white blood cell count, unspecified: Secondary | ICD-10-CM | POA: Diagnosis present

## 2015-07-14 DIAGNOSIS — E785 Hyperlipidemia, unspecified: Secondary | ICD-10-CM | POA: Diagnosis present

## 2015-07-14 DIAGNOSIS — Z6828 Body mass index (BMI) 28.0-28.9, adult: Secondary | ICD-10-CM

## 2015-07-14 DIAGNOSIS — Z9181 History of falling: Secondary | ICD-10-CM

## 2015-07-14 DIAGNOSIS — E876 Hypokalemia: Secondary | ICD-10-CM | POA: Diagnosis present

## 2015-07-14 DIAGNOSIS — Z23 Encounter for immunization: Secondary | ICD-10-CM

## 2015-07-14 DIAGNOSIS — Z8249 Family history of ischemic heart disease and other diseases of the circulatory system: Secondary | ICD-10-CM

## 2015-07-14 DIAGNOSIS — Z7901 Long term (current) use of anticoagulants: Secondary | ICD-10-CM

## 2015-07-14 DIAGNOSIS — R197 Diarrhea, unspecified: Secondary | ICD-10-CM | POA: Insufficient documentation

## 2015-07-14 DIAGNOSIS — I480 Paroxysmal atrial fibrillation: Secondary | ICD-10-CM | POA: Diagnosis not present

## 2015-07-14 DIAGNOSIS — Z8673 Personal history of transient ischemic attack (TIA), and cerebral infarction without residual deficits: Secondary | ICD-10-CM

## 2015-07-14 DIAGNOSIS — R159 Full incontinence of feces: Secondary | ICD-10-CM | POA: Diagnosis present

## 2015-07-14 DIAGNOSIS — F05 Delirium due to known physiological condition: Secondary | ICD-10-CM | POA: Diagnosis present

## 2015-07-14 DIAGNOSIS — F0390 Unspecified dementia without behavioral disturbance: Secondary | ICD-10-CM | POA: Diagnosis present

## 2015-07-14 DIAGNOSIS — R109 Unspecified abdominal pain: Secondary | ICD-10-CM

## 2015-07-14 DIAGNOSIS — I10 Essential (primary) hypertension: Secondary | ICD-10-CM | POA: Diagnosis present

## 2015-07-14 HISTORY — DX: Diverticulitis of intestine, part unspecified, without perforation or abscess without bleeding: K57.92

## 2015-07-14 HISTORY — DX: Creutzfeldt-Jakob disease, unspecified: A81.00

## 2015-07-14 HISTORY — DX: Other complications of anesthesia, initial encounter: T88.59XA

## 2015-07-14 HISTORY — DX: Adverse effect of unspecified anesthetic, initial encounter: T41.45XA

## 2015-07-14 LAB — CBC WITH DIFFERENTIAL/PLATELET
BASOS ABS: 0 10*3/uL (ref 0.0–0.1)
BASOS PCT: 0 %
Eosinophils Absolute: 0 10*3/uL (ref 0.0–0.7)
Eosinophils Relative: 0 %
HEMATOCRIT: 35.6 % — AB (ref 36.0–46.0)
HEMOGLOBIN: 11.9 g/dL — AB (ref 12.0–15.0)
Lymphocytes Relative: 9 %
Lymphs Abs: 1 10*3/uL (ref 0.7–4.0)
MCH: 29.2 pg (ref 26.0–34.0)
MCHC: 33.4 g/dL (ref 30.0–36.0)
MCV: 87.5 fL (ref 78.0–100.0)
Monocytes Absolute: 0.6 10*3/uL (ref 0.1–1.0)
Monocytes Relative: 5 %
NEUTROS ABS: 10.1 10*3/uL — AB (ref 1.7–7.7)
NEUTROS PCT: 86 %
Platelets: 215 10*3/uL (ref 150–400)
RBC: 4.07 MIL/uL (ref 3.87–5.11)
RDW: 13.3 % (ref 11.5–15.5)
WBC: 11.8 10*3/uL — ABNORMAL HIGH (ref 4.0–10.5)

## 2015-07-14 LAB — COMPREHENSIVE METABOLIC PANEL
ALBUMIN: 3.6 g/dL (ref 3.5–5.0)
ALK PHOS: 54 U/L (ref 38–126)
ALT: 16 U/L (ref 14–54)
AST: 30 U/L (ref 15–41)
Anion gap: 11 (ref 5–15)
BILIRUBIN TOTAL: 0.6 mg/dL (ref 0.3–1.2)
BUN: 38 mg/dL — ABNORMAL HIGH (ref 6–20)
CALCIUM: 9.2 mg/dL (ref 8.9–10.3)
CO2: 21 mmol/L — ABNORMAL LOW (ref 22–32)
CREATININE: 1.44 mg/dL — AB (ref 0.44–1.00)
Chloride: 104 mmol/L (ref 101–111)
GFR calc Af Amer: 39 mL/min — ABNORMAL LOW (ref >60)
GFR calc non Af Amer: 34 mL/min — ABNORMAL LOW (ref >60)
GLUCOSE: 160 mg/dL — AB (ref 65–99)
POTASSIUM: 4.1 mmol/L (ref 3.5–5.1)
Sodium: 136 mmol/L (ref 135–145)
TOTAL PROTEIN: 6.1 g/dL — AB (ref 6.5–8.1)

## 2015-07-14 LAB — I-STAT CHEM 8, ED
BUN: 38 mg/dL — ABNORMAL HIGH (ref 6–20)
CREATININE: 1.4 mg/dL — AB (ref 0.44–1.00)
Calcium, Ion: 1.09 mmol/L — ABNORMAL LOW (ref 1.13–1.30)
Chloride: 103 mmol/L (ref 101–111)
Glucose, Bld: 147 mg/dL — ABNORMAL HIGH (ref 65–99)
HEMATOCRIT: 38 % (ref 36.0–46.0)
HEMOGLOBIN: 12.9 g/dL (ref 12.0–15.0)
POTASSIUM: 4.1 mmol/L (ref 3.5–5.1)
SODIUM: 138 mmol/L (ref 135–145)
TCO2: 21 mmol/L (ref 0–100)

## 2015-07-14 LAB — LIPASE, BLOOD: Lipase: 31 U/L (ref 11–51)

## 2015-07-14 MED ORDER — SODIUM CHLORIDE 0.9 % IV BOLUS (SEPSIS)
1000.0000 mL | Freq: Once | INTRAVENOUS | Status: AC
  Administered 2015-07-15: 1000 mL via INTRAVENOUS

## 2015-07-14 MED ORDER — SODIUM CHLORIDE 0.9 % IV BOLUS (SEPSIS)
1000.0000 mL | Freq: Once | INTRAVENOUS | Status: AC
  Administered 2015-07-14: 1000 mL via INTRAVENOUS

## 2015-07-14 NOTE — ED Notes (Signed)
EMS was called to home c/o abd. Pain and diarrhea, she was found to be hypotensive (90/60) was given NS (98/56) upon arrival.  It appeared that her A fib was uncontrolled b/t denies no chest pain.  New diagnosis of dementia but daughter reports that she appears to be declining quickly.

## 2015-07-14 NOTE — ED Provider Notes (Signed)
CSN: [id number]     Arrival date & time 07/14/15  2134 History   First MD Initiated Contact with Patient 07/14/15 2146     Chief Complaint  Patient presents with  . Abdominal Pain  . Diarrhea  . Hypotension     (Consider location/radiation/quality/duration/timing/severity/associated sxs/prior Treatment) Patient is a 81 y.o. female presenting with diarrhea.  Diarrhea Quality:  Watery Severity:  Mild Onset quality:  Sudden Number of episodes:  5 Duration:  5 hours Timing:  Constant Relieved by:  None tried Worsened by:  Nothing tried Ineffective treatments:  None tried Associated symptoms: no abdominal pain   Risk factors: no recent antibiotic use, no sick contacts, no suspicious food intake and no travel to endemic areas     Past Medical History  Diagnosis Date  . Paroxysmal atrial fibrillation (HCC)   . Sinus bradycardia   . Hypothyroid   . Coronary artery disease     prior cath without blockages per pt  . Hyperlipidemia   . Hypertension   . Stroke (HCC) 09/2008    residual balance deficit  . Hypotension   . Elevated troponin     a. 08/2014 post AF ablation.  . Obesity   . Atrial flutter (HCC)     a. Multiple atypical atrial flutter circuits, too unstable for mapping/ ablation by EP study 08/21/14.  . Atrial tachycardia (HCC)     a. Atach identified on event monitoring per Dr. Jenel LucksAllred's note.  . Hyperglycemia   . Elevated serum creatinine     a. Per labs 08/2014.   Past Surgical History  Procedure Laterality Date  . Rotator cuff repair    . Ganglion cyst excision    . Knee arthroscopy    . Tee without cardioversion N/A 08/20/2014    Procedure: TRANSESOPHAGEAL ECHOCARDIOGRAM (TEE);  Surgeon: Lewayne BuntingBrian S Crenshaw, MD;  [facility];  Service: Cardiovascular;  Laterality: N/A;  . Electrophysiologic study N/A 08/21/2014    Procedure: Atrial Fibrillation Ablation;  Surgeon: Hillis RangeJames Allred, MD;  [facility];  Service: Cardiovascular;  Laterality: N/A;   . Breast cyst aspiration Right     1962 negative   Family History  Problem Relation Age of Onset  . Stroke Mother   . Heart disease Mother   . Heart disease Father   . Stroke Father   . Seizures Sister   . Parkinsonism Brother   . Breast cancer Neg Hx    Social History  Substance Use Topics  . Smoking status: Never Smoker   . Smokeless tobacco: None  . Alcohol Use: No   OB History    No data available     Review of Systems  Unable to perform ROS: Dementia  Gastrointestinal: Positive for diarrhea. Negative for abdominal pain.  All other systems reviewed and are negative.     Allergies  Review of patient's allergies indicates no known allergies.  Home Medications   Prior to Admission medications   Medication Sig Start Date End Date Taking? Authorizing Provider  Calcium Carbonate-Vitamin D 600-125 MG-UNIT TABS Take 1 tablet by mouth daily at 6 PM.     Historical Provider, MD  Cholecalciferol (VITAMIN D3) 2000 UNITS TABS Take 2,000 Units by mouth daily.     Historical Provider, MD  levothyroxine (SYNTHROID, LEVOTHROID) 125 MCG tablet Take 125 mcg by mouth daily before breakfast.    Historical Provider, MD  metoprolol tartrate (LOPRESSOR) 25 MG tablet Take 0.5 mg by mouth daily. 10/26/14   Historical Provider, MD  Omega-3 Fatty Acids (OMEGA-3 FISH OIL) 300 MG CAPS Take 1 tablet by mouth daily at 6 PM.     Historical Provider, MD  Prenat w/o A-FE-Methf-FA-Omega (PNV-OMEGA) 28-0.6-0.4-340 MG CAPS Take 1 tablet by mouth daily at 6 PM.     Historical Provider, MD  simvastatin (ZOCOR) 20 MG tablet Take 20 mg by mouth daily at 6 PM.     Historical Provider, MD  warfarin (COUMADIN) 2.5 MG tablet Take 1 tablet (2.5 mg total) by mouth daily at 6 PM. 08/24/14   Dayna N Dunn, PA-C   BP 115/65 mmHg  Pulse 111  Temp(Src) 98.1 F (36.7 C) (Oral)  Resp 15  SpO2 100% Physical Exam  Constitutional: She is oriented to person, place, and time. She appears well-developed and  well-nourished.  HENT:  Head: Normocephalic and atraumatic.  Neck: Normal range of motion.  Cardiovascular: Regular rhythm.  Tachycardia present.   Pulmonary/Chest: No stridor. No respiratory distress. She has no wheezes. She has no rales.  Abdominal: Soft. She exhibits no distension. There is tenderness. There is no rebound and no guarding.  Musculoskeletal: Normal range of motion. She exhibits no edema or tenderness.  Neurological: She is alert and oriented to person, place, and time.  Nursing note and vitals reviewed.   ED Course  Procedures (including critical care time) Labs Review Labs Reviewed  CBC WITH DIFFERENTIAL/PLATELET - Abnormal; Notable for the following:    WBC 11.8 (*)    Hemoglobin 11.9 (*)    HCT 35.6 (*)    Neutro Abs 10.1 (*)    All other components within normal limits  COMPREHENSIVE METABOLIC PANEL - Abnormal; Notable for the following:    CO2 21 (*)    Glucose, Bld 160 (*)    BUN 38 (*)    Creatinine, Ser 1.44 (*)    Total Protein 6.1 (*)    GFR calc non Af Amer 34 (*)    GFR calc Af Amer 39 (*)    All other components within normal limits  URINALYSIS, ROUTINE W REFLEX MICROSCOPIC (NOT AT [facility]) - Abnormal; Notable for the following:    Leukocytes, UA MODERATE (*)    All other components within normal limits  URINE MICROSCOPIC-ADD ON - Abnormal; Notable for the following:    Squamous Epithelial / LPF 0-5 (*)    Bacteria, UA RARE (*)    Casts HYALINE CASTS (*)    All other components within normal limits  PHOSPHORUS - Abnormal; Notable for the following:    Phosphorus 2.4 (*)    All other components within normal limits  I-STAT CHEM 8, ED - Abnormal; Notable for the following:    BUN 38 (*)    Creatinine, Ser 1.40 (*)    Glucose, Bld 147 (*)    Calcium, Ion 1.09 (*)    All other components within normal limits  GASTROINTESTINAL PANEL BY PCR, STOOL (REPLACES STOOL CULTURE)  LIPASE, BLOOD  MAGNESIUM  PROTIME-INR  TROPONIN I  I-STAT CG4 LACTIC  ACID, ED    Imaging Review No results found. I have personally reviewed and evaluated these images and lab results as part of my medical decision-making.   EKG Interpretation None      MDM   Final diagnoses:  None   Diarrhea and low bp at home, improved here. Demented on exam and not able to give history, so history obtained from daughter over telephone. Slightly tachy with ttp diffusely in abdomen. D/w daughter and it all started today, no recent  abx.  Plan for monitoring, hdyration, ct scan and stool studies. Dc if ok, admit if abnormality.   Patient stable in ED with slight tachycardia, normal BP's. Minimal AKI, likely prerenal from dehydration. No more diarrhea. At time of transfer of care, plan to await CT results and disposition accordingly.     Marily Memos, MD 07/15/15 1114

## 2015-07-15 ENCOUNTER — Encounter (HOSPITAL_COMMUNITY): Payer: Self-pay | Admitting: Radiology

## 2015-07-15 ENCOUNTER — Emergency Department (HOSPITAL_COMMUNITY): Payer: Medicare HMO

## 2015-07-15 DIAGNOSIS — I480 Paroxysmal atrial fibrillation: Secondary | ICD-10-CM | POA: Diagnosis present

## 2015-07-15 DIAGNOSIS — G9340 Encephalopathy, unspecified: Secondary | ICD-10-CM | POA: Diagnosis not present

## 2015-07-15 DIAGNOSIS — F0390 Unspecified dementia without behavioral disturbance: Secondary | ICD-10-CM | POA: Diagnosis present

## 2015-07-15 DIAGNOSIS — E785 Hyperlipidemia, unspecified: Secondary | ICD-10-CM | POA: Diagnosis not present

## 2015-07-15 DIAGNOSIS — A419 Sepsis, unspecified organism: Secondary | ICD-10-CM

## 2015-07-15 DIAGNOSIS — R159 Full incontinence of feces: Secondary | ICD-10-CM | POA: Diagnosis not present

## 2015-07-15 DIAGNOSIS — I129 Hypertensive chronic kidney disease with stage 1 through stage 4 chronic kidney disease, or unspecified chronic kidney disease: Secondary | ICD-10-CM | POA: Diagnosis not present

## 2015-07-15 DIAGNOSIS — Z6828 Body mass index (BMI) 28.0-28.9, adult: Secondary | ICD-10-CM | POA: Diagnosis not present

## 2015-07-15 DIAGNOSIS — Z8249 Family history of ischemic heart disease and other diseases of the circulatory system: Secondary | ICD-10-CM | POA: Diagnosis not present

## 2015-07-15 DIAGNOSIS — Z66 Do not resuscitate: Secondary | ICD-10-CM | POA: Diagnosis not present

## 2015-07-15 DIAGNOSIS — A8100 Creutzfeldt-Jakob disease, unspecified: Secondary | ICD-10-CM | POA: Diagnosis present

## 2015-07-15 DIAGNOSIS — Z79899 Other long term (current) drug therapy: Secondary | ICD-10-CM | POA: Diagnosis not present

## 2015-07-15 DIAGNOSIS — Z23 Encounter for immunization: Secondary | ICD-10-CM | POA: Diagnosis not present

## 2015-07-15 DIAGNOSIS — Z823 Family history of stroke: Secondary | ICD-10-CM | POA: Diagnosis not present

## 2015-07-15 DIAGNOSIS — E0590 Thyrotoxicosis, unspecified without thyrotoxic crisis or storm: Secondary | ICD-10-CM | POA: Diagnosis not present

## 2015-07-15 DIAGNOSIS — I4891 Unspecified atrial fibrillation: Secondary | ICD-10-CM | POA: Diagnosis present

## 2015-07-15 DIAGNOSIS — R1084 Generalized abdominal pain: Secondary | ICD-10-CM | POA: Diagnosis present

## 2015-07-15 DIAGNOSIS — Z8673 Personal history of transient ischemic attack (TIA), and cerebral infarction without residual deficits: Secondary | ICD-10-CM | POA: Diagnosis not present

## 2015-07-15 DIAGNOSIS — E860 Dehydration: Secondary | ICD-10-CM | POA: Diagnosis not present

## 2015-07-15 DIAGNOSIS — F05 Delirium due to known physiological condition: Secondary | ICD-10-CM | POA: Diagnosis not present

## 2015-07-15 DIAGNOSIS — K5732 Diverticulitis of large intestine without perforation or abscess without bleeding: Secondary | ICD-10-CM | POA: Diagnosis present

## 2015-07-15 DIAGNOSIS — E876 Hypokalemia: Secondary | ICD-10-CM | POA: Diagnosis not present

## 2015-07-15 DIAGNOSIS — Z9181 History of falling: Secondary | ICD-10-CM | POA: Diagnosis not present

## 2015-07-15 DIAGNOSIS — K5792 Diverticulitis of intestine, part unspecified, without perforation or abscess without bleeding: Secondary | ICD-10-CM | POA: Diagnosis present

## 2015-07-15 DIAGNOSIS — E039 Hypothyroidism, unspecified: Secondary | ICD-10-CM | POA: Diagnosis not present

## 2015-07-15 DIAGNOSIS — D72829 Elevated white blood cell count, unspecified: Secondary | ICD-10-CM | POA: Diagnosis not present

## 2015-07-15 DIAGNOSIS — R32 Unspecified urinary incontinence: Secondary | ICD-10-CM | POA: Diagnosis not present

## 2015-07-15 DIAGNOSIS — E669 Obesity, unspecified: Secondary | ICD-10-CM | POA: Diagnosis not present

## 2015-07-15 DIAGNOSIS — Z7901 Long term (current) use of anticoagulants: Secondary | ICD-10-CM | POA: Diagnosis not present

## 2015-07-15 DIAGNOSIS — I2510 Atherosclerotic heart disease of native coronary artery without angina pectoris: Secondary | ICD-10-CM | POA: Diagnosis not present

## 2015-07-15 DIAGNOSIS — N183 Chronic kidney disease, stage 3 (moderate): Secondary | ICD-10-CM | POA: Diagnosis not present

## 2015-07-15 DIAGNOSIS — I959 Hypotension, unspecified: Secondary | ICD-10-CM | POA: Diagnosis not present

## 2015-07-15 LAB — URINE MICROSCOPIC-ADD ON: RBC / HPF: NONE SEEN RBC/hpf (ref 0–5)

## 2015-07-15 LAB — PROTIME-INR
INR: 1.17 (ref 0.00–1.49)
Prothrombin Time: 15.1 seconds (ref 11.6–15.2)

## 2015-07-15 LAB — URINALYSIS, ROUTINE W REFLEX MICROSCOPIC
BILIRUBIN URINE: NEGATIVE
Glucose, UA: NEGATIVE mg/dL
HGB URINE DIPSTICK: NEGATIVE
Ketones, ur: NEGATIVE mg/dL
Nitrite: NEGATIVE
PROTEIN: NEGATIVE mg/dL
Specific Gravity, Urine: 1.021 (ref 1.005–1.030)
pH: 5 (ref 5.0–8.0)

## 2015-07-15 LAB — MAGNESIUM: MAGNESIUM: 1.7 mg/dL (ref 1.7–2.4)

## 2015-07-15 LAB — HEPARIN LEVEL (UNFRACTIONATED): Heparin Unfractionated: 0.39 IU/mL (ref 0.30–0.70)

## 2015-07-15 LAB — MRSA PCR SCREENING: MRSA by PCR: NEGATIVE

## 2015-07-15 LAB — I-STAT CG4 LACTIC ACID, ED: Lactic Acid, Venous: 1.63 mmol/L (ref 0.5–2.0)

## 2015-07-15 LAB — TROPONIN I: Troponin I: <0.03 ng/mL (ref <0.031)

## 2015-07-15 LAB — PHOSPHORUS: PHOSPHORUS: 2.4 mg/dL — AB (ref 2.5–4.6)

## 2015-07-15 MED ORDER — ADENOSINE 6 MG/2ML IV SOLN
12.0000 mg | Freq: Once | INTRAVENOUS | Status: AC | PRN
  Administered 2015-07-15: 12 mg via INTRAVENOUS
  Filled 2015-07-15: qty 4

## 2015-07-15 MED ORDER — SODIUM CHLORIDE 0.9 % IV BOLUS (SEPSIS)
1000.0000 mL | Freq: Once | INTRAVENOUS | Status: AC
  Administered 2015-07-15: 1000 mL via INTRAVENOUS

## 2015-07-15 MED ORDER — METOPROLOL TARTRATE 25 MG PO TABS: 0.5000 mg | ORAL_TABLET | Freq: Every day | ORAL | Status: DC

## 2015-07-15 MED ORDER — ADENOSINE 6 MG/2ML IV SOLN
INTRAVENOUS | Status: AC
  Filled 2015-07-15: qty 12

## 2015-07-15 MED ORDER — ACETAMINOPHEN 325 MG PO TABS: 650.0000 mg | ORAL_TABLET | ORAL | Status: DC | PRN

## 2015-07-15 MED ORDER — ADENOSINE 6 MG/2ML IV SOLN: 30.0000 mg | Freq: Once | INTRAVENOUS | Status: DC

## 2015-07-15 MED ORDER — PIPERACILLIN-TAZOBACTAM 3.375 G IVPB 30 MIN
3.3750 g | Freq: Once | INTRAVENOUS | Status: AC
  Administered 2015-07-15: 3.375 g via INTRAVENOUS
  Filled 2015-07-15: qty 50

## 2015-07-15 MED ORDER — SODIUM PHOSPHATE 3 MMOLE/ML IV SOLN
10.0000 mmol | Freq: Once | INTRAVENOUS | Status: AC
  Administered 2015-07-15: 10 mmol via INTRAVENOUS
  Filled 2015-07-15: qty 3.33

## 2015-07-15 MED ORDER — WARFARIN SODIUM 2.5 MG PO TABS: 2.5000 mg | ORAL_TABLET | Freq: Every day | ORAL | Status: DC

## 2015-07-15 MED ORDER — HEPARIN (PORCINE) IN NACL 100-0.45 UNIT/ML-% IJ SOLN
1150.0000 [IU]/h | INTRAMUSCULAR | Status: DC
  Administered 2015-07-15: 950 [IU]/h via INTRAVENOUS
  Filled 2015-07-15 (×2): qty 250

## 2015-07-15 MED ORDER — AMIODARONE HCL IN DEXTROSE 360-4.14 MG/200ML-% IV SOLN
30.0000 mg/h | INTRAVENOUS | Status: DC
  Administered 2015-07-15: 30 mg/h via INTRAVENOUS
  Filled 2015-07-15: qty 200

## 2015-07-15 MED ORDER — METOPROLOL TARTRATE 1 MG/ML IV SOLN
5.0000 mg | Freq: Three times a day (TID) | INTRAVENOUS | Status: DC
  Administered 2015-07-15 – 2015-07-16 (×3): 5 mg via INTRAVENOUS
  Filled 2015-07-15 (×3): qty 5

## 2015-07-15 MED ORDER — ADENOSINE 6 MG/2ML IV SOLN
6.0000 mg | Freq: Once | INTRAVENOUS | Status: AC
  Administered 2015-07-15: 12 mg via INTRAVENOUS
  Filled 2015-07-15: qty 2

## 2015-07-15 MED ORDER — MAGNESIUM SULFATE 2 GM/50ML IV SOLN
2.0000 g | Freq: Once | INTRAVENOUS | Status: AC
  Administered 2015-07-15: 2 g via INTRAVENOUS
  Filled 2015-07-15: qty 50

## 2015-07-15 MED ORDER — ADENOSINE 6 MG/2ML IV SOLN
12.0000 mg | Freq: Once | INTRAVENOUS | Status: DC | PRN
  Filled 2015-07-15: qty 4

## 2015-07-15 MED ORDER — DEXTROSE 5 % IV SOLN
300.0000 mg | Freq: Once | INTRAVENOUS | Status: AC
  Administered 2015-07-15: 300 mg via INTRAVENOUS
  Filled 2015-07-15: qty 6

## 2015-07-15 MED ORDER — DEXTROSE 5 % IV SOLN: 60.0000 mg/h | Freq: Once | INTRAVENOUS | Status: DC

## 2015-07-15 MED ORDER — ZOLPIDEM TARTRATE 5 MG PO TABS
5.0000 mg | ORAL_TABLET | Freq: Every evening | ORAL | Status: DC | PRN
  Administered 2015-07-18: 5 mg via ORAL
  Filled 2015-07-15: qty 1

## 2015-07-15 MED ORDER — ONDANSETRON HCL 4 MG/2ML IJ SOLN: 4.0000 mg | Freq: Four times a day (QID) | INTRAMUSCULAR | Status: DC | PRN

## 2015-07-15 MED ORDER — IOPAMIDOL (ISOVUE-300) INJECTION 61%
INTRAVENOUS | Status: AC
  Administered 2015-07-15: 75 mL
  Filled 2015-07-15: qty 75

## 2015-07-15 MED ORDER — PIPERACILLIN-TAZOBACTAM 3.375 G IVPB
3.3750 g | Freq: Three times a day (TID) | INTRAVENOUS | Status: DC
  Administered 2015-07-15 – 2015-07-19 (×11): 3.375 g via INTRAVENOUS
  Filled 2015-07-15 (×14): qty 50

## 2015-07-15 MED ORDER — LEVOTHYROXINE SODIUM 25 MCG PO TABS
125.0000 ug | ORAL_TABLET | Freq: Every day | ORAL | Status: DC
  Administered 2015-07-16: 125 ug via ORAL
  Filled 2015-07-15: qty 1

## 2015-07-15 MED ORDER — METRONIDAZOLE IN NACL 5-0.79 MG/ML-% IV SOLN
500.0000 mg | Freq: Once | INTRAVENOUS | Status: AC
  Administered 2015-07-15: 500 mg via INTRAVENOUS
  Filled 2015-07-15: qty 100

## 2015-07-15 MED ORDER — METRONIDAZOLE IN NACL 5-0.79 MG/ML-% IV SOLN
500.0000 mg | Freq: Three times a day (TID) | INTRAVENOUS | Status: DC
  Administered 2015-07-15 – 2015-07-16 (×2): 500 mg via INTRAVENOUS
  Filled 2015-07-15 (×4): qty 100

## 2015-07-15 MED ORDER — METOPROLOL TARTRATE 12.5 MG HALF TABLET
12.5000 mg | ORAL_TABLET | Freq: Every day | ORAL | Status: DC
  Filled 2015-07-15: qty 1

## 2015-07-15 MED ORDER — SIMVASTATIN 20 MG PO TABS
20.0000 mg | ORAL_TABLET | Freq: Every day | ORAL | Status: DC
  Administered 2015-07-15 – 2015-07-19 (×5): 20 mg via ORAL
  Filled 2015-07-15 (×6): qty 1

## 2015-07-15 MED ORDER — HEPARIN BOLUS VIA INFUSION
3000.0000 [IU] | Freq: Once | INTRAVENOUS | Status: AC
  Administered 2015-07-15: 3000 [IU] via INTRAVENOUS
  Filled 2015-07-15: qty 3000

## 2015-07-15 MED ORDER — AMIODARONE HCL IN DEXTROSE 360-4.14 MG/200ML-% IV SOLN
60.0000 mg/h | Freq: Once | INTRAVENOUS | Status: AC
  Administered 2015-07-15: 60 mg/h via INTRAVENOUS
  Filled 2015-07-15: qty 200

## 2015-07-15 NOTE — Consult Note (Addendum)
Referring Physician: Mora Bellmanni Primary Cardiologist: Paraschos EP: Allred Reason for Consultation:  AF   HPI:  Ms. Rhonda Graham is a 81 y/o woman with h/o dementia, PAF/AFL (s/p ablation 5/16), obesity, CKD, HTN and previous stroke who we are asked to consult on by Dr. Mora Bellmanni in the ER due to recurrent AF.  She has a h/o of multiple atrial tachyarrhythmias including PAF, AFL and atrial tach. Underwent AF ablation by Dr. Johney FrameAllred in 5/16 and did well. Propafenone stopped. EF normal by echo 4/16.   Brought to the ER tonight for ab pain and diarrhea. BP on arrival 90/50 so given 500cc NS. By nurses report was in AF on arrival at 9p but I cannot locate ECG.  Had CT scan which showed possible diverticulitis. On coming back from CT. HR noted to be 170. Received stacked doses of adenosine with no response followed by DC-CV in ER. Apparently went to NSR for a few beats and then back to AF with RVR. BP dropped after DC-CV and we were asked to consult.   She ws unable to provide a history.   I gave her amiodarone 300mg  x1 and started gtt. V-rate now ~ 110.    Review of Systems: Unable to obtain meaningful ROS due to patient's dementia   Past Medical History  Diagnosis Date  . Paroxysmal atrial fibrillation (HCC)   . Sinus bradycardia   . Hypothyroid   . Coronary artery disease     prior cath without blockages per pt  . Hyperlipidemia   . Hypertension   . Stroke (HCC) 09/2008    residual balance deficit  . Hypotension   . Elevated troponin     a. 08/2014 post AF ablation.  . Obesity   . Atrial flutter (HCC)     a. Multiple atypical atrial flutter circuits, too unstable for mapping/ ablation by EP study 08/21/14.  . Atrial tachycardia (HCC)     a. Atach identified on event monitoring per Dr. Jenel LucksAllred's note.  . Hyperglycemia   . Elevated serum creatinine     a. Per labs 08/2014.     (Not in a hospital admission)  Prior to Admission medications   Medication Sig Start Date End Date Taking?  Authorizing Provider  Calcium Carbonate-Vitamin D 600-125 MG-UNIT TABS Take 1 tablet by mouth daily at 6 PM.     Historical Provider, MD  Cholecalciferol (VITAMIN D3) 2000 UNITS TABS Take 2,000 Units by mouth daily.     Historical Provider, MD  levothyroxine (SYNTHROID, LEVOTHROID) 125 MCG tablet Take 125 mcg by mouth daily before breakfast.    Historical Provider, MD  metoprolol tartrate (LOPRESSOR) 25 MG tablet Take 0.5 mg by mouth daily. 10/26/14   Historical Provider, MD  Omega-3 Fatty Acids (OMEGA-3 FISH OIL) 300 MG CAPS Take 1 tablet by mouth daily at 6 PM.     Historical Provider, MD  Prenat w/o A-FE-Methf-FA-Omega (PNV-OMEGA) 28-0.6-0.4-340 MG CAPS Take 1 tablet by mouth daily at 6 PM.     Historical Provider, MD  simvastatin (ZOCOR) 20 MG tablet Take 20 mg by mouth daily at 6 PM.     Historical Provider, MD  warfarin (COUMADIN) 2.5 MG tablet Take 1 tablet (2.5 mg total) by mouth daily at 6 PM. 08/24/14   Dayna N Dunn, PA-C       Infusions: . piperacillin-tazobactam      No Known Allergies  Social History   Social History  . Marital Status: Single    Spouse Name: N/A  .  Number of Children: N/A  . Years of Education: N/A   Occupational History  . Not on file.   Social History Main Topics  . Smoking status: Never Smoker   . Smokeless tobacco: Not on file  . Alcohol Use: No  . Drug Use: No  . Sexual Activity: Not on file   Other Topics Concern  . Not on file   Social History Narrative   Pt lives in [place] alone.  Divorced   Retired from Warehouse manager work    Family History  Problem Relation Age of Onset  . Stroke Mother   . Heart disease Mother   . Heart disease Father   . Stroke Father   . Seizures Sister   . Parkinsonism Brother   . Breast cancer Neg Hx     PHYSICAL EXAM: Filed Vitals:   07/15/15 0133 07/15/15 0200  BP:  109/68  Pulse: 84 117  Temp:    Resp: 18 24     Intake/Output Summary (Last 24 hours) at 07/15/15 0552 Last data filed at 07/15/15  0205  Gross per 24 hour  Intake   2000 ml  Output      0 ml  Net   2000 ml    General:  Elderly confused HEENT: normal Neck: supple. no JVD. Carotids 2+ bilat; no bruits. No lymphadenopathy or thryomegaly appreciated. Cor: PMI nondisplaced. Irregular rate & rhythm. Tachy No rubs, gallops or murmurs. Lungs: clear Abdomen: soft, mildly tendertender, nondistended. No hepatosplenomegaly. No bruits or masses. Good bowel sounds. Extremities: no cyanosis, clubbing, rash, edema Neuro: alert & oriented x 3, cranial nerves grossly intact. moves all 4 extremities w/o difficulty. Affect pleasant.  ECG: AF 131 low volts. Nonspecific ST-T abnormalities  Results for orders placed or performed during the hospital encounter of 07/14/15 (from the past 24 hour(s))  CBC with Differential     Status: Abnormal   Collection Time: 07/14/15 10:18 PM  Result Value Ref Range   WBC 11.8 (H) 4.0 - 10.5 K/uL   RBC 4.07 3.87 - 5.11 MIL/uL   Hemoglobin 11.9 (L) 12.0 - 15.0 g/dL   HCT 16.1 (L) 09.6 - 04.5 %   MCV 87.5 78.0 - 100.0 fL   MCH 29.2 26.0 - 34.0 pg   MCHC 33.4 30.0 - 36.0 g/dL   RDW 40.9 81.1 - 91.4 %   Platelets 215 150 - 400 K/uL   Neutrophils Relative % 86 %   Neutro Abs 10.1 (H) 1.7 - 7.7 K/uL   Lymphocytes Relative 9 %   Lymphs Abs 1.0 0.7 - 4.0 K/uL   Monocytes Relative 5 %   Monocytes Absolute 0.6 0.1 - 1.0 K/uL   Eosinophils Relative 0 %   Eosinophils Absolute 0.0 0.0 - 0.7 K/uL   Basophils Relative 0 %   Basophils Absolute 0.0 0.0 - 0.1 K/uL  Comprehensive metabolic panel     Status: Abnormal   Collection Time: 07/14/15 10:18 PM  Result Value Ref Range   Sodium 136 135 - 145 mmol/L   Potassium 4.1 3.5 - 5.1 mmol/L   Chloride 104 101 - 111 mmol/L   CO2 21 (L) 22 - 32 mmol/L   Glucose, Bld 160 (H) 65 - 99 mg/dL   BUN 38 (H) 6 - 20 mg/dL   Creatinine, Ser 7.82 (H) 0.44 - 1.00 mg/dL   Calcium 9.2 8.9 - 95.6 mg/dL   Total Protein 6.1 (L) 6.5 - 8.1 g/dL   Albumin 3.6 3.5 - 5.0 g/dL  AST 30 15 - 41 U/L   ALT 16 14 - 54 U/L   Alkaline Phosphatase 54 38 - 126 U/L   Total Bilirubin 0.6 0.3 - 1.2 mg/dL   GFR calc non Af Amer 34 (L) >60 mL/min   GFR calc Af Amer 39 (L) >60 mL/min   Anion gap 11 5 - 15  Lipase, blood     Status: None   Collection Time: 07/14/15 10:18 PM  Result Value Ref Range   Lipase 31 11 - 51 U/L  I-Stat Chem 8, ED     Status: Abnormal   Collection Time: 07/14/15 10:59 PM  Result Value Ref Range   Sodium 138 135 - 145 mmol/L   Potassium 4.1 3.5 - 5.1 mmol/L   Chloride 103 101 - 111 mmol/L   BUN 38 (H) 6 - 20 mg/dL   Creatinine, Ser 1.61 (H) 0.44 - 1.00 mg/dL   Glucose, Bld 096 (H) 65 - 99 mg/dL   Calcium, Ion 0.45 (L) 1.13 - 1.30 mmol/L   TCO2 21 0 - 100 mmol/L   Hemoglobin 12.9 12.0 - 15.0 g/dL   HCT 40.9 81.1 - 91.4 %  Magnesium     Status: None   Collection Time: 07/15/15  1:32 AM  Result Value Ref Range   Magnesium 1.7 1.7 - 2.4 mg/dL  I-Stat CG4 Lactic Acid, ED     Status: None   Collection Time: 07/15/15  5:20 AM  Result Value Ref Range   Lactic Acid, Venous 1.63 0.5 - 2.0 mmol/L   Ct Abdomen Pelvis W Contrast  07/15/2015  CLINICAL DATA:  Abdominal pain and diarrhea EXAM: CT ABDOMEN AND PELVIS WITH CONTRAST TECHNIQUE: Multidetector CT imaging of the abdomen and pelvis was performed using the standard protocol following bolus administration of intravenous contrast. CONTRAST:  75ml ISOVUE-300 IOPAMIDOL (ISOVUE-300) INJECTION 61% COMPARISON:  None. FINDINGS: Lower chest: Mild atelectatic appearing linear opacities in both bases. No consolidation. No effusion. Hepatobiliary: Mild thickening and stranding of the gallbladder, which also appears to be contracted. No conclusive calculi. No bile duct dilatation. No focal liver lesions. Pancreas: Normal Spleen: 1.4 cm hypodense focus, not characterized but more likely benign. Otherwise unremarkable. Adrenals/Urinary Tract: The adrenals and kidneys are normal in appearance. There is no urinary  calculus evident. There is no hydronephrosis or ureteral dilatation. Collecting systems and ureters appear unremarkable. Stomach/Bowel: The stomach, small bowel and appendix are normal. There is moderate colonic diverticulosis. There is inflammation centered on the appendix of the distal sigmoid, axial image 70 series 201, and this likely represents mild diverticulitis. No abscess. No extraluminal air. Vascular/Lymphatic: The abdominal aorta is normal in caliber. There is mild atherosclerotic calcification. There is no adenopathy in the abdomen or pelvis. Reproductive: Hysterectomy.  No adnexal abnormalities. Other: No ascites. Musculoskeletal: No significant musculoskeletal lesion. There is remote benign compression of L1. IMPRESSION: 1. Mild changes of acute diverticulitis, distal sigmoid colon. 2. Mild thickening and stranding of the gallbladder. This raises the question of cholecystitis although it could be a chronic cholecystitis rather than acute. Recommend right upper quadrant ultrasound for evaluation. 3. Remote benign appearing compression of L1. Electronically Signed   By: Ellery Plunk M.D.   On: 07/15/2015 04:43     ASSESSMENT: 1. PAF with RVR --s/p AF ablation (previously on propafenone) - 5/16 - failed DC-CV in ER 07/15/15 -This patients CHA2DS2-VASc Score is 7-8    (age, female, HTN, stroke,early DM, ?vascular disease) - Echo 4/16 EF > 55%. LA size read as  normal - Previous cath with reported no CAD 2. Dementia 3. CKD 4. Diarrhea/?diverticulitis 5. Previous CVA 6. Hypotension, improved  PLAN/DISCUSSION:  She has recurrent PAF with RVR in setting of diarrhea/possible acute diverticulitis.Will start IV amiodarone. Check INR (will need heparin if INR < 2.0). Will ask EP to follow-up. Can check echo.  Bensimhon, Daniel,MD 6:07 AM

## 2015-07-15 NOTE — Consult Note (Signed)
PULMONARY / CRITICAL CARE MEDICINE   Name: Rhonda Graham MRN: [id number] DOB: 08/25/1934    ADMISSION DATE:  07/14/2015 CONSULTATION DATE:  07/14/15  REFERRING MD:  ED  CHIEF COMPLAINT:  Diarrhea, abdominal pain.  HISTORY OF PRESENT ILLNESS:  History obtained from chart as pt is confused and there is no family at bedside. 81 Y/O with PMH of dementia, paroxysmal afib,hypothyroidism, CAD, stroke. Admitted with abdominal pain, diarrhea for several days. CT scan of the abdomen shows diverticulitis,? Cholecystitis. SBP in the 70s on arrival. She is received 2 L of fluid with good response. She went into rapid A. fib. Unsuccessful with adenosine and cardioversion. Cardiology consulted and she is on amiodarone drip. PCCM called for cossult  PAST MEDICAL HISTORY :  She  has a past medical history of Paroxysmal atrial fibrillation (HCC); Sinus bradycardia; Hypothyroid; Coronary artery disease; Hyperlipidemia; Hypertension; Stroke [facility]) (09/2008); Hypotension; Elevated troponin; Obesity; Atrial flutter (HCC); Atrial tachycardia (HCC); Hyperglycemia; and Elevated serum creatinine.  PAST SURGICAL HISTORY: She  has past surgical history that includes Rotator cuff repair; Ganglion cyst excision; Knee arthroscopy; TEE without cardioversion (N/A, 08/20/2014); Cardiac catheterization (N/A, 08/21/2014); and Breast cyst aspiration (Right).  No Known Allergies  No current facility-administered medications on file prior to encounter.   Current Outpatient Prescriptions on File Prior to Encounter  Medication Sig  . Calcium Carbonate-Vitamin D 600-125 MG-UNIT TABS Take 1 tablet by mouth daily at 6 PM.   . Cholecalciferol (VITAMIN D3) 2000 UNITS TABS Take 2,000 Units by mouth daily.   Marland Kitchen levothyroxine (SYNTHROID, LEVOTHROID) 125 MCG tablet Take 125 mcg by mouth daily before breakfast.  . metoprolol tartrate (LOPRESSOR) 25 MG tablet Take 0.5 mg by mouth daily.  . Omega-3 Fatty Acids (OMEGA-3 FISH OIL) 300 MG CAPS  Take 1 tablet by mouth daily at 6 PM.   . Prenat w/o A-FE-Methf-FA-Omega (PNV-OMEGA) 28-0.6-0.4-340 MG CAPS Take 1 tablet by mouth daily at 6 PM.   . simvastatin (ZOCOR) 20 MG tablet Take 20 mg by mouth daily at 6 PM.   . warfarin (COUMADIN) 2.5 MG tablet Take 1 tablet (2.5 mg total) by mouth daily at 6 PM.    FAMILY HISTORY:  Her indicated that her mother is deceased. She indicated that her father is deceased.   SOCIAL HISTORY: She  reports that she has never smoked. She does not have any smokeless tobacco history on file. She reports that she does not drink alcohol or use illicit drugs.  REVIEW OF SYSTEMS:   Unable to obtain as pt is confused.  SUBJECTIVE:   VITAL SIGNS: BP 146/78 mmHg  Pulse 111  Temp(Src) 98.1 F (36.7 C) (Oral)  Resp 17  SpO2 100%  HEMODYNAMICS:   VENTILATOR SETTINGS:   INTAKE / OUTPUT: I/O last 3 completed shifts: In: 2000 [I.V.:2000] Out: -   PHYSICAL EXAMINATION: General:  Awake, no distress, confused, rambling speech Neuro:  Moves all 4 extremities, no gross focal deficits HEENT:  Moist mucous membranes, no thyromegaly, JVD Cardiovascular:  RRR, No MRG Lungs:  Clear, no wheeze, cracjles Abdomen:  Obese.soft, tenderness in lower quadrants, + BS Musculoskeletal:  Normal tone and bulk. Skin: Intact  LABS:  BMET  Recent Labs Lab 07/14/15 2218 07/14/15 2259  NA 136 138  K 4.1 4.1  CL 104 103  CO2 21*  --   BUN 38* 38*  CREATININE 1.44* 1.40*  GLUCOSE 160* 147*    Electrolytes  Recent Labs Lab 07/14/15 2218 07/15/15 0132  CALCIUM 9.2  --  MG  --  1.7    CBC  Recent Labs Lab 07/14/15 2218 07/14/15 2259  WBC 11.8*  --   HGB 11.9* 12.9  HCT 35.6* 38.0  PLT 215  --     Coag's No results for input(s): APTT, INR in the last 168 hours.  Sepsis Markers  Recent Labs Lab 07/15/15 0520  LATICACIDVEN 1.63    ABG No results for input(s): PHART, PCO2ART, PO2ART in the last 168 hours.  Liver Enzymes  Recent  Labs Lab 07/14/15 2218  AST 30  ALT 16  ALKPHOS 54  BILITOT 0.6  ALBUMIN 3.6    Cardiac Enzymes No results for input(s): TROPONINI, PROBNP in the last 168 hours.  Glucose No results for input(s): GLUCAP in the last 168 hours.  Imaging Ct Abdomen Pelvis W Contrast  07/15/2015  CLINICAL DATA:  Abdominal pain and diarrhea EXAM: CT ABDOMEN AND PELVIS WITH CONTRAST TECHNIQUE: Multidetector CT imaging of the abdomen and pelvis was performed using the standard protocol following bolus administration of intravenous contrast. CONTRAST:  75ml ISOVUE-300 IOPAMIDOL (ISOVUE-300) INJECTION 61% COMPARISON:  None. FINDINGS: Lower chest: Mild atelectatic appearing linear opacities in both bases. No consolidation. No effusion. Hepatobiliary: Mild thickening and stranding of the gallbladder, which also appears to be contracted. No conclusive calculi. No bile duct dilatation. No focal liver lesions. Pancreas: Normal Spleen: 1.4 cm hypodense focus, not characterized but more likely benign. Otherwise unremarkable. Adrenals/Urinary Tract: The adrenals and kidneys are normal in appearance. There is no urinary calculus evident. There is no hydronephrosis or ureteral dilatation. Collecting systems and ureters appear unremarkable. Stomach/Bowel: The stomach, small bowel and appendix are normal. There is moderate colonic diverticulosis. There is inflammation centered on the appendix of the distal sigmoid, axial image 70 series 201, and this likely represents mild diverticulitis. No abscess. No extraluminal air. Vascular/Lymphatic: The abdominal aorta is normal in caliber. There is mild atherosclerotic calcification. There is no adenopathy in the abdomen or pelvis. Reproductive: Hysterectomy.  No adnexal abnormalities. Other: No ascites. Musculoskeletal: No significant musculoskeletal lesion. There is remote benign compression of L1. IMPRESSION: 1. Mild changes of acute diverticulitis, distal sigmoid colon. 2. Mild thickening  and stranding of the gallbladder. This raises the question of cholecystitis although it could be a chronic cholecystitis rather than acute. Recommend right upper quadrant ultrasound for evaluation. 3. Remote benign appearing compression of L1. Electronically Signed   By: Ellery Plunk M.D.   On: 07/15/2015 04:43   US Abdomen Limited Ruq  07/15/2015  CLINICAL DATA:  Abdominal pain.  Abnormal CT. EXAM: US ABDOMEN LIMITED - RIGHT UPPER QUADRANT COMPARISON:  CT 07/15/2015 FINDINGS: Gallbladder: No cholelithiasis. No gallbladder wall thickening or pericholecystic fluid. Common bile duct: Diameter: Normal, 1.8 mm. Liver: Diffuse parenchymal echogenicity suggesting fatty infiltration or other hepatic cellular disease. No focal liver lesion is evident. IMPRESSION: No evidence of significant gallbladder disease. There is mild hepatic echogenicity suggesting fatty infiltration. Electronically Signed   By: Ellery Plunk M.D.   On: 07/15/2015 06:00     STUDIES:  CT abd, pelvis 4/13 > Mild diverticultits, ? Cholecystitis.  CULTURES: Bcx 4/13>  ANTIBIOTICS: Flagyl 4/13 > Zosyn 4/13 >  SIGNIFICANT EVENTS:  LINES/TUBES:  DISCUSSION: 81 year old admitted with sepsis from diverticulitis, ? Cholecystitis. Developed rapid A. fib with RVR in ED. Noted to have low BP which has responded well to fluids, and lactic acid is normal. Heart rate is now better controlled on amiodarone. She is okay for stable on admission.  RECCS: - OK for  admission to SDU - Continue fluid reuscitation at 75 cc/hr - RUQ ultrasound to eval for cholecystitis. Follow LFTs - Amiodarone as per cardiology. - Continue zosyn and flagyl. Check C.Diff - Code status is DNR/DNI.  PCCM will sign off. Please call with any questions.  Chilton GreathousePraveen Nancey Kreitz MD [facility] Pager [phone] If no answer or after 3pm call: [phone] 07/15/2015, 10:28 AM

## 2015-07-15 NOTE — ED Provider Notes (Signed)
Patient singed out as pending CT scan for diarrhea and abdominal pain.  She has dementia so I can not obtain further history and daughter has left.  I was called to the room for HR of 180.  EKG appears to be a narrow complex regular tachycardia.  Appears to be SVT to me.  She was given adenosine 6mg , followed by 12 without any return to sinus.  Patient was flatline EKG during administration, no underlysing fib or flutter waves seen.  She began to get hypotensive so she was electrically cardioverted, but that attempted failed as well.  At this point I contact Dr. Amie PortlandBensimhion who recs to give amio bolus and drip which was done.  HR now back in the 110s but still in a junction rhythm without p waves.  I followed through with CT scan plan which shows diverticulitis with possible chole.  RUQ was negative.  Patient admitted to hospitalist step down unit, as requested by cardiology for further care.   She was given zosyn for treatment.    CRITICAL CARE Performed by: Tomasita CrumbleNI,Seara Hinesley   Total critical care time: 60 minutes - SVT, requiring adenosine, electrical shock, and amiodarone drip  Critical care time was exclusive of separately billable procedures and treating other patients.  Critical care was necessary to treat or prevent imminent or life-threatening deterioration.  Critical care was time spent personally by me on the following activities: development of treatment plan with patient and/or surrogate as well as nursing, discussions with consultants, evaluation of patient's response to treatment, examination of patient, obtaining history from patient or surrogate, ordering and performing treatments and interventions, ordering and review of laboratory studies, ordering and review of radiographic studies, pulse oximetry and re-evaluation of patient's condition.   Tomasita CrumbleAdeleke Harl Wiechmann, MD 07/15/15 [phone]

## 2015-07-15 NOTE — Progress Notes (Addendum)
Carryover patient from Dr. Mora Bellmanni 81 year old female with dementia( history must be obtained via patient's daughter), but patient had been complaining of belly pain and diarrhea for last few days. CT scan of abdomen showed signs of cholecystitis and diverticulitis. Ultrasound of the abdomen showed no signs of cholecystitis. Patient was given antibiotics of Rocephin all in the ED. Heart rates were seen to be in the 120s most night and then increase to 180s. Initially thought to be in SVT for which they try to convert her with adenosine and subsequently shock without improvement with heart rates. Cardiology consulted and recommended patient be started on amiodarone drip. Patient was bolused with 300 mg of amiodarone and then placed on drip. Heart rates are improved currently in the 110's all other vitals stable.

## 2015-07-15 NOTE — ED Notes (Signed)
Patient transported to Ultrasound w/ RN

## 2015-07-15 NOTE — Consult Note (Signed)
ELECTROPHYSIOLOGY CONSULT NOTE    Patient ID: Rhonda Graham MRN: [id number], DOB/AGE: [birth year] 81 y.o.  Admit date: 07/14/2015 Date of Consult: 07/15/2015  Primary Physician: Rozanna Box, MD Primary Cardiologist: Dr. Darrold Junker Electrophysiologist: DFr. Allred (Dr. Kirtland Bouchard. [facility] at [facility] previously)  Reason for Consultation: Afib, RVR  HPI: Rhonda Graham is a 81 y.o. female with PMHx of PAFib/flutter, CRI, CVA, HTN, came to [facility] ER yesterday with c/o abdominal pain and diarrhea, she has been found with possible acute cholecystitis vs chronic cholecystits as well as acute diverticulitis, she was hypotensive with SBP 90's and given IVF.  Cardiology was consulted to her case last night for tachycardia, initially treated unsuccesfuly with stacked doses of adenosine and then DCCV to very few beats of SR into AFib with RVR.  She was started given amio bolus and started on amiodarone gtt, her HR currently 100-115.  The patient is oriented to self only, denies any physical complaints at this time. No family at bedside at this time.  Record reviewed, ER RN note states the daughter mentioned a new diagnosis of dementia that the daughter stated her mother seemed to be declining rapidly.  There are no noted c/o CP.  EMS was called for abd pain, diarrhea and low BP at home.  The patient's daughter states the patient had complained about some generalized abd pain and cramping at home followed by multiple episodes of watery diarrhea, no noted blood.  No complaints of CP.   Notable labs: INR 1.17 BUN/Creat 38/1.44, 38/1.40 LFTs wnl H/H stable Trop <0.03    PAFib Hx: AFib ablation 08/21/14, had post procedure hypotension felt seconday to anesthesia          Post ablation had some ERAF, though at her f/u with Dr. Johney Frame 11/23/14, had not had any further and her Propafenone stopped Aflutter, multiple atrial flutter circuits to unstable for mapping/ablation during May 2016 procedure Atrial tachycardia  described on an EM  1999 1st noted with AFib  AAD hx Amiodarone failed with noting thyroid abnormality Propafenone failed with recurrent PAF, stopped post ablation  Past Medical History  Diagnosis Date  . Paroxysmal atrial fibrillation (HCC)   . Sinus bradycardia   . Hypothyroid   . Coronary artery disease     prior cath without blockages per pt  . Hyperlipidemia   . Hypertension   . Stroke (HCC) 09/2008    residual balance deficit  . Hypotension   . Elevated troponin     a. 08/2014 post AF ablation.  . Obesity   . Atrial flutter (HCC)     a. Multiple atypical atrial flutter circuits, too unstable for mapping/ ablation by EP study 08/21/14.  . Atrial tachycardia (HCC)     a. Atach identified on event monitoring per Dr. Jenel Lucks note.  . Hyperglycemia   . Elevated serum creatinine     a. Per labs 08/2014.     Surgical History:  Past Surgical History  Procedure Laterality Date  . Rotator cuff repair    . Ganglion cyst excision    . Knee arthroscopy    . Tee without cardioversion N/A 08/20/2014    Procedure: TRANSESOPHAGEAL ECHOCARDIOGRAM (TEE);  Surgeon: Lewayne Bunting, MD;  Location: [facility] ENDOSCOPY;  Service: Cardiovascular;  Laterality: N/A;  . Electrophysiologic study N/A 08/21/2014    Procedure: Atrial Fibrillation Ablation;  Surgeon: Hillis Range, MD;  Location: [facility] INVASIVE CV LAB;  Service: Cardiovascular;  Laterality: N/A;  . Breast cyst aspiration Right  1962 negative      (Not in a hospital admission)  Inpatient Medications:    Allergies: No Known Allergies  Social History   Social History  . Marital Status: Single    Spouse Name: N/A  . Number of Children: N/A  . Years of Education: N/A   Occupational History  . Not on file.   Social History Main Topics  . Smoking status: Never Smoker   . Smokeless tobacco: Not on file  . Alcohol Use: No  . Drug Use: No  . Sexual Activity: Not on file   Other Topics Concern  . Not on file   Social History  Narrative   Pt lives in [place] alone.  Divorced   Retired from Warehouse manager work     Family History  Problem Relation Age of Onset  . Stroke Mother   . Heart disease Mother   . Heart disease Father   . Stroke Father   . Seizures Sister   . Parkinsonism Brother   . Breast cancer Neg Hx      Review of Systems: Unable to obtain given dementia  Physical Exam: Filed Vitals:   07/15/15 0445 07/15/15 0545 07/15/15 0724 07/15/15 0901  BP: 111/74 120/87 129/64 146/78  Pulse: 94 114 111   Temp:      TempSrc:      Resp: SpO2: 100% 100% 100%     GEN- The appears in no distress, alert and oriented to self only.   HEENT: normocephalic, atraumatic; sclera clear, conjunctiva pink; hearing intact; oropharynx clear; neck supple, no JVP Lymph- no cervical lymphadenopathy Lungs- Clear to ausculation bilaterally, normal work of breathing.  No wheezes, rales, rhonchi Heart- Irregular rate and rhythm, no significant murmurs, rubs or gallops, PMI not laterally displaced GI- soft, non-tender to light palpation, non-distended, no gaurding Extremities- no clubbing, cyanosis, or edema MS- no significant deformity or atrophy Skin- warm and dry, no rash or lesion Psych- pleasant,  Neuro- AAO to self, follows directions for exam, no gross motor deficits appreciated   Labs:   Lab Results  Component Value Date   WBC 11.8* 07/14/2015   HGB 12.9 07/14/2015   HCT 38.0 07/14/2015   MCV 87.5 07/14/2015   PLT 215 07/14/2015    Recent Labs Lab 07/14/15 2218 07/14/15 2259  NA 136 138  K 4.1 4.1  CL 104 103  CO2 21*  --   BUN 38* 38*  CREATININE 1.44* 1.40*  CALCIUM 9.2  --   PROT 6.1*  --   BILITOT 0.6  --   ALKPHOS 54  --   ALT 16  --   AST 30  --   GLUCOSE 160* 147*      Radiology/Studies:  Ct Abdomen Pelvis W Contrast 07/15/2015  CLINICAL DATA:  Abdominal pain and diarrhea EXAM: CT ABDOMEN AND PELVIS WITH CONTRAST TECHNIQUE: Multidetector CT imaging of the abdomen and pelvis  was performed using the standard protocol following bolus administration of intravenous contrast. CONTRAST:  75ml ISOVUE-300 IOPAMIDOL (ISOVUE-300) INJECTION 61% COMPARISON:  None. FINDINGS: Lower chest: Mild atelectatic appearing linear opacities in both bases. No consolidation. No effusion. Hepatobiliary: Mild thickening and stranding of the gallbladder, which also appears to be contracted. No conclusive calculi. No bile duct dilatation. No focal liver lesions. Pancreas: Normal Spleen: 1.4 cm hypodense focus, not characterized but more likely benign. Otherwise unremarkable. Adrenals/Urinary Tract: The adrenals and kidneys are normal in appearance. There is no urinary calculus evident. There is no  hydronephrosis or ureteral dilatation. Collecting systems and ureters appear unremarkable. Stomach/Bowel: The stomach, small bowel and appendix are normal. There is moderate colonic diverticulosis. There is inflammation centered on the appendix of the distal sigmoid, axial image 70 series 201, and this likely represents mild diverticulitis. No abscess. No extraluminal air. Vascular/Lymphatic: The abdominal aorta is normal in caliber. There is mild atherosclerotic calcification. There is no adenopathy in the abdomen or pelvis. Reproductive: Hysterectomy.  No adnexal abnormalities. Other: No ascites. Musculoskeletal: No significant musculoskeletal lesion. There is remote benign compression of L1. IMPRESSION: 1. Mild changes of acute diverticulitis, distal sigmoid colon. 2. Mild thickening and stranding of the gallbladder. This raises the question of cholecystitis although it could be a chronic cholecystitis rather than acute. Recommend right upper quadrant ultrasound for evaluation. 3. Remote benign appearing compression of L1. Electronically Signed   By: Ellery Plunk M.D.   On: 07/15/2015 04:43   US Abdomen Limited Ruq 07/15/2015  CLINICAL DATA:  Abdominal pain.  Abnormal CT. EXAM: US ABDOMEN LIMITED - RIGHT UPPER  QUADRANT COMPARISON:  CT 07/15/2015 FINDINGS: Gallbladder: No cholelithiasis. No gallbladder wall thickening or pericholecystic fluid. Common bile duct: Diameter: Normal, 1.8 mm. Liver: Diffuse parenchymal echogenicity suggesting fatty infiltration or other hepatic cellular disease. No focal liver lesion is evident. IMPRESSION: No evidence of significant gallbladder disease. There is mild hepatic echogenicity suggesting fatty infiltration. Electronically Signed   By: Ellery Plunk M.D.   On: 07/15/2015 06:00    EKG: #1-5, AFib with RVR 169--178bpm, QS V1-3, nonspecific diffuse ST/T changes #6, AFib, RVR, 133bpm TELEMETRY: currently Afib,  100-115  08/20/14: TEE Study Conclusions - Left ventricle: Systolic function was normal. The estimated  ejection fraction was in the range of 55% to 60%. Wall motion was  normal; there were no regional wall motion abnormalities. - Aortic valve: No evidence of vegetation. There was trivial  regurgitation. - Mitral valve: No evidence of vegetation. There was mild  regurgitation. - Left atrium: The atrium was moderately dilated. No evidence of  thrombus in the atrial cavity or appendage. - Right atrium: The atrium was moderately dilated. - Atrial septum: No defect or patent foramen ovale was identified. - Tricuspid valve: No evidence of vegetation. There was  mild-moderate regurgitation. - Pulmonic valve: No evidence of vegetation. Impressions: - Normal LV function; moderate biatrial enlargement; no LAA  thrombus; trace AI; mild MR; mild to moderate TR.  Assessment and Plan:   1. Abdominal c/o, diarrhea     Possible mild diverticulitis by CT scan, possible cholecystitis not appreciated on [place]     Deferred to attending/medicine service  2. Hypotension     S/p IVF bolus     Normotensive this morning  3. AFib with RVR, in setting of acute abdominal process     now with fair rate control in amiodarone gtt     CHA2DS2Vasc is at least 7 on  warfarin at home     INR is sub-therapeutic, recommend heparin gtt  ADDENDUM: After further discussion with Dr. Elberta Fortis, the patient's daughter has stated that the pattient has not been on Warfarin since December, secondary to safety concerns given advancing dementia,  given this Briget Shaheed pursue rate control strategy for now, stop the amiodarone and start betablocker, and for now pending further discussion continue heparin gtt.  4. Dementia     Spoke with patient's daughter, diagnosed with dementia in November   Signed, Renee Ursuy, [place] 07/15/2015 9:05 AM  I have seen and examined this patient with [facility]  Keitha ButteUrsuy.  Agree with above, note added to reflect my findings.  On exam, tachycardic, irregular, no murmurs, lungs clear.  Presented today with abdominal pain and found to be in atrial fibrillation with RVR on presentation.  Was initially started on amiodarone GGT and attempted cardioversion as she was very tachycardic and hypotensive.  Quickly went back into AF.  Was not anticoagulated prior to this as she was deemed a fall risk.  Due to the fact that she has not been anticoagulated, would not plan for cardioversion at this time.  Would plan for rate control.  She is currently on heparin.  Would continue her on heparin. She has a high CHADS2VSSC of at least 7 and would therefore benefit from anticoagulation.  Needs further discussion with family.  Anastasija Anfinson M. Olie Scaffidi MD 07/15/2015 3:29 PM

## 2015-07-15 NOTE — Progress Notes (Signed)
ANTICOAGULATION CONSULT NOTE  Pharmacy Consult for heparin Indication: atrial fibrillation  No Known Allergies  Patient Measurements: Height: 5\' 6"  (167.6 cm) Weight: 167 lb 12.8 oz (76.114 kg) IBW/kg (Calculated) : 59.3 Heparin Dosing Weight: 75kg  Vital Signs: Temp: 98.1 F (36.7 C) (04/13 1939) Temp Source: Oral (04/13 1939) BP: 148/96 mmHg (04/13 1600) Pulse Rate: 70 (04/13 1400)  Labs:  Recent Labs  07/14/15 2218 07/14/15 2259 07/15/15 0849 07/15/15 1845  HGB 11.9* 12.9  --   --   HCT 35.6* 38.0  --   --   PLT 215  --   --   --   LABPROT  --   --  15.1  --   INR  --   --  1.17  --   HEPARINUNFRC  --   --   --  0.39  CREATININE 1.44* 1.40*  --   --   TROPONINI  --   --  <0.03  --     Estimated Creatinine Clearance: 34.5 mL/min (by C-G formula based on Cr of 1.4).  Assessment: [age] YOF here with abdominal pain and diarrhea for several days. With history of AFib, went into AFib with RVR and started on amiodarone drip. To start heparin.  Per last medication history list, she was on warfarin however we are awaiting family arrival to confirm most recent list as patient has dementia. INR this morning is normal at 1.17. Hgb 11.9, plts 215- no bleeding noted.  Initial HL is therapeutic at 0.39 on heparin 950 units/hr. No issues with infusion or bleeding noted.  Goal of Therapy:  Heparin level 0.3-0.7 units/ml Monitor platelets by anticoagulation protocol: Yes   Plan:  Continue heparin 950 units/hr Daily HL and CBC Monitor s/sx of bleeding   Arlean Hoppingorey M. Newman PiesBall, PharmD, BCPS Clinical Pharmacist Pager [phone]  07/15/2015 8:25 PM

## 2015-07-15 NOTE — H&P (Signed)
[facility] History and Physical  [name]:[id number] DOB: May 13, 1934 DOA: 07/14/2015  Referring physician:  PCP: Rozanna Box, MD   Chief Complaint: "It kept coming."  HPI: Rhonda Graham is a 81 y.o. female   Past medical history significant for paroxysmal A. fib and is photic disease. Patient brought in by daughter for concerns of massive diarrhea 10 by patient. Stool was watery nonbloody. No melena. Dr. states the patient has had diverticulitis numerous times. Feels this time is worse than other occurrences. Due to that and concerns for dehydration she call EMS. Patient was then go to [facility] but EMS assessment patient had A. fib with RVR so she was brought to [facility].  Note: Patient was recently started on Seroquel for agitation last night was her second dose  CODE STATUS - DNR, DNI  Review of Systems: Limited review of systems gathered from daughter patient has CJD Constitutional: agitation  Night sweats, Fevers HEENT: no URI sx No headaches, Difficulty swallowing Cardio-vascular:  No chest pain,  dizziness GI:  No  abdominal pain, nausea, vomiting; see history of present illness Resp:  No shortness of breath with exertion or at rest Skin: no rash or lesions.  GU:  no dysuria Musculoskeletal:  No joint pain or swelling Psych: agitation Neuro: No change in sensation, unilateral strength, or cognitive abilities  All other systems were reviewed and are negative.  Past Medical History  Diagnosis Date  . Paroxysmal atrial fibrillation (HCC)   . Sinus bradycardia   . Hypothyroid   . Coronary artery disease     prior cath without blockages per pt  . Hyperlipidemia   . Hypertension   . Stroke (HCC) 09/2008    residual balance deficit  . Hypotension   . Elevated troponin     a. 08/2014 post AF ablation.  . Obesity   . Atrial flutter (HCC)     a. Multiple atypical atrial flutter circuits, too unstable for mapping/ ablation by EP study  08/21/14.  . Atrial tachycardia (HCC)     a. Atach identified on event monitoring per Dr. Jenel Lucks note.  . Hyperglycemia   . Elevated serum creatinine     a. Per labs 08/2014.  Marland Kitchen CJD (Creutzfeldt-Jakob disease)    Past Surgical History  Procedure Laterality Date  . Rotator cuff repair    . Ganglion cyst excision    . Knee arthroscopy    . Tee without cardioversion N/A 08/20/2014    Procedure: TRANSESOPHAGEAL ECHOCARDIOGRAM (TEE);  Surgeon: Lewayne Bunting, MD;  Location: [facility] ENDOSCOPY;  Service: Cardiovascular;  Laterality: N/A;  . Electrophysiologic study N/A 08/21/2014    Procedure: Atrial Fibrillation Ablation;  Surgeon: Hillis Range, MD;  Location: [facility] INVASIVE CV LAB;  Service: Cardiovascular;  Laterality: N/A;  . Breast cyst aspiration Right     1962 negative   Social History:  reports that she has never smoked. She does not have any smokeless tobacco history on file. She reports that she does not drink alcohol or use illicit drugs.  No Known Allergies  Family History  Problem Relation Age of Onset  . Stroke Mother   . Heart disease Mother   . Heart disease Father   . Stroke Father   . Seizures Sister   . Parkinsonism Brother   . Breast cancer Neg Hx      Prior to Admission medications   Medication Sig Start Date End Date Taking? Authorizing Provider  Calcium Carbonate-Vitamin D 600-125 MG-UNIT TABS Take  1 tablet by mouth daily at 6 PM.     Historical Provider, MD  Cholecalciferol (VITAMIN D3) 2000 UNITS TABS Take 2,000 Units by mouth daily.     Historical Provider, MD  levothyroxine (SYNTHROID, LEVOTHROID) 125 MCG tablet Take 125 mcg by mouth daily before breakfast.    Historical Provider, MD  metoprolol tartrate (LOPRESSOR) 25 MG tablet Take 0.5 mg by mouth daily. 10/26/14   Historical Provider, MD  Omega-3 Fatty Acids (OMEGA-3 FISH OIL) 300 MG CAPS Take 1 tablet by mouth daily at 6 PM.     Historical Provider, MD  Prenat w/o A-FE-Methf-FA-Omega (PNV-OMEGA) 28-0.6-0.4-340  MG CAPS Take 1 tablet by mouth daily at 6 PM.     Historical Provider, MD  simvastatin (ZOCOR) 20 MG tablet Take 20 mg by mouth daily at 6 PM.     Historical Provider, MD  warfarin (COUMADIN) 2.5 MG tablet Take 1 tablet (2.5 mg total) by mouth daily at 6 PM. 08/24/14   Laurann Montanaayna N Dunn, PA-C   Physical Exam: Filed Vitals:   07/15/15 0545 07/15/15 0724 07/15/15 0901 07/15/15 0952  BP: 120/87 129/64 146/78 136/79  Pulse: 114 111  108  Temp:      TempSrc:      Resp: 24 17  20   SpO2: 100% 100%  99%    Wt Readings from Last 3 Encounters:  11/23/14 78.654 kg (173 lb 6.4 oz)  08/21/14 82.9 kg (182 lb 12.2 oz)  08/20/14 83.008 kg (183 lb)    General:  Appears calm and comfortable Eyes: PERRL, EOMI, normal lids, iris ENT: grossly normal hearing, lips & tongue Neck:  no masses or thyromegaly Cardiovascular: Irr Irr, no m/r/g. No LE edema.  Respiratory:  CTA bilaterally, no w/r/r. Normal respiratory effort. Abdomen: soft, ntnd Skin: no rash or induration seen on limited exam Musculoskeletal: grossly normal tone BUE/BLE Psychiatric: grossly normal mood and affect, speech fluent and appropriate; A&Ox1 Neurologic: CN 2-12 grossly intact, moves all extremities in coordinated fashion.          Labs on Admission:  Basic Metabolic Panel:  Recent Labs Lab 07/14/15 2218 07/14/15 2259 07/15/15 0132 07/15/15 0849  NA 136 138  --   --   K 4.1 4.1  --   --   CL 104 103  --   --   CO2 21*  --   --   --   GLUCOSE 160* 147*  --   --   BUN 38* 38*  --   --   CREATININE 1.44* 1.40*  --   --   CALCIUM 9.2  --   --   --   MG  --   --  1.7  --   PHOS  --   --   --  2.4*   Liver Function Tests:  Recent Labs Lab 07/14/15 2218  AST 30  ALT 16  ALKPHOS 54  BILITOT 0.6  PROT 6.1*  ALBUMIN 3.6    Recent Labs Lab 07/14/15 2218  LIPASE 31   No results for input(s): AMMONIA in the last 168 hours. CBC:  Recent Labs Lab 07/14/15 2218 07/14/15 2259  WBC 11.8*  --   NEUTROABS 10.1*  --    HGB 11.9* 12.9  HCT 35.6* 38.0  MCV 87.5  --   PLT 215  --    Cardiac Enzymes:  Recent Labs Lab 07/15/15 0849  TROPONINI <0.03    BNP (last 3 results) No results for input(s): BNP in the last 8760  hours.  ProBNP (last 3 results) No results for input(s): PROBNP in the last 8760 hours.   Creatinine clearance cannot be calculated (Unknown ideal weight.)  CBG: No results for input(s): GLUCAP in the last 168 hours.  Radiological Exams on Admission: Ct Abdomen Pelvis W Contrast  07/15/2015  CLINICAL DATA:  Abdominal pain and diarrhea EXAM: CT ABDOMEN AND PELVIS WITH CONTRAST TECHNIQUE: Multidetector CT imaging of the abdomen and pelvis was performed using the standard protocol following bolus administration of intravenous contrast. CONTRAST:  75ml ISOVUE-300 IOPAMIDOL (ISOVUE-300) INJECTION 61% COMPARISON:  None. FINDINGS: Lower chest: Mild atelectatic appearing linear opacities in both bases. No consolidation. No effusion. Hepatobiliary: Mild thickening and stranding of the gallbladder, which also appears to be contracted. No conclusive calculi. No bile duct dilatation. No focal liver lesions. Pancreas: Normal Spleen: 1.4 cm hypodense focus, not characterized but more likely benign. Otherwise unremarkable. Adrenals/Urinary Tract: The adrenals and kidneys are normal in appearance. There is no urinary calculus evident. There is no hydronephrosis or ureteral dilatation. Collecting systems and ureters appear unremarkable. Stomach/Bowel: The stomach, small bowel and appendix are normal. There is moderate colonic diverticulosis. There is inflammation centered on the appendix of the distal sigmoid, axial image 70 series 201, and this likely represents mild diverticulitis. No abscess. No extraluminal air. Vascular/Lymphatic: The abdominal aorta is normal in caliber. There is mild atherosclerotic calcification. There is no adenopathy in the abdomen or pelvis. Reproductive: Hysterectomy.  No adnexal  abnormalities. Other: No ascites. Musculoskeletal: No significant musculoskeletal lesion. There is remote benign compression of L1. IMPRESSION: 1. Mild changes of acute diverticulitis, distal sigmoid colon. 2. Mild thickening and stranding of the gallbladder. This raises the question of cholecystitis although it could be a chronic cholecystitis rather than acute. Recommend right upper quadrant ultrasound for evaluation. 3. Remote benign appearing compression of L1. Electronically Signed   By: Ellery Plunk M.D.   On: 07/15/2015 04:43   US Abdomen Limited Ruq  07/15/2015  CLINICAL DATA:  Abdominal pain.  Abnormal CT. EXAM: US ABDOMEN LIMITED - RIGHT UPPER QUADRANT COMPARISON:  CT 07/15/2015 FINDINGS: Gallbladder: No cholelithiasis. No gallbladder wall thickening or pericholecystic fluid. Common bile duct: Diameter: Normal, 1.8 mm. Liver: Diffuse parenchymal echogenicity suggesting fatty infiltration or other hepatic cellular disease. No focal liver lesion is evident. IMPRESSION: No evidence of significant gallbladder disease. There is mild hepatic echogenicity suggesting fatty infiltration. Electronically Signed   By: Ellery Plunk M.D.   On: 07/15/2015 06:00    EKG: Independently reviewed. Afib with RVR. Low Voltage. No stemi.  Assessment/Plan Principal Problem:   Atrial fibrillation with rapid ventricular response (HCC) Active Problems:   Essential hypertension   Diverticulitis   Hypothyroid   HLD (hyperlipidemia)  Diverticulitis -Acute diverticulitis single CT scan First dose of Zosyn given in the emergency room, added Flagyl; decided not to use for quinolone due to the arrhythmia Strict I's and O's Clear liquid diet A.m.CBC BMP When necessary Tylenol for fever Enteric Precautions  Atrial fibrillation with RVR -Patient is currently on amiodarone drip with cardiology consult, input appreciated Discussed case with a critical care refills patient is more stable. Elevated We'll  optimize electrolytes, patient getting mag IV currently, phosphorus 2.4 we'll replace Check an INR in the morning, currently subtherapeutic at 1.17 Troponin on admission undetectable  Essential hypertension -Current status is stable We'll hold patient's antihypertensive medications for now will monitor and consider restarting on the morning  Hypothyroid -Getting a TSH, tachycardia is likely result of infection and dehydration and hypothyroidism  but will follow-up TSH We'll continue patient's Synthroid for now  Hyperlipidemia -Continue patient's statin  CJD/Agitation - holding seroquel due to possible pro arrhythmia effects  Code Status: DNR/DNI  DVT Prophylaxis: SCD, warfarin Family Communication: [phone] - spoke to dgtr who is POA Disposition Plan: Pending Improvement    Haydee Salter, MD Family Medicine [facility] [email or web address] Password [email or web address]

## 2015-07-15 NOTE — ED Notes (Signed)
Discussing code status with daughter who is POA.  Pt is only Do Not Intubate, wishes everything else to be done.

## 2015-07-15 NOTE — Progress Notes (Signed)
ANTICOAGULATION CONSULT NOTE - Initial Consult  Pharmacy Consult for heparin Indication: atrial fibrillation  No Known Allergies  Patient Measurements: Weight: 173 lb 8 oz (78.7 kg) Heparin Dosing Weight: 75kg  Vital Signs: BP: 162/82 mmHg (04/13 1048) Pulse Rate: 114 (04/13 1048)  Labs:  Recent Labs  07/14/15 2218 07/14/15 2259 07/15/15 0849  HGB 11.9* 12.9  --   HCT 35.6* 38.0  --   PLT 215  --   --   LABPROT  --   --  15.1  INR  --   --  1.17  CREATININE 1.44* 1.40*  --   TROPONINI  --   --  <0.03    CrCl cannot be calculated (Unknown ideal weight.).  Assessment: [age] YOF here with abdominal pain and diarrhea for several days. With history of AFib, went into AFib with RVR and started on amiodarone drip. To start heparin.  Per last medication history list, she was on warfarin however we are awaiting family arrival to confirm most recent list as patient has dementia. INR this morning is normal at 1.17. Hgb 11.9, plts 215- no bleeding noted.  Goal of Therapy:  Heparin level 0.3-0.7 units/ml Monitor platelets by anticoagulation protocol: Yes   Plan:  -heparin bolus with 3000 units IV x1, then start infusion at 950 units/hr -heparin level in 8h -daily HL and CBC -follow for s/s bleeding and long term anticoagulation plan  Atiya Yera D. Holdan Stucke, PharmD, BCPS Clinical Pharmacist Pager: [phone] 07/15/2015 11:10 AM

## 2015-07-16 ENCOUNTER — Inpatient Hospital Stay (HOSPITAL_COMMUNITY): Payer: Medicare HMO

## 2015-07-16 DIAGNOSIS — N183 Chronic kidney disease, stage 3 unspecified: Secondary | ICD-10-CM | POA: Diagnosis present

## 2015-07-16 DIAGNOSIS — R197 Diarrhea, unspecified: Secondary | ICD-10-CM | POA: Insufficient documentation

## 2015-07-16 DIAGNOSIS — E039 Hypothyroidism, unspecified: Secondary | ICD-10-CM

## 2015-07-16 DIAGNOSIS — G9340 Encephalopathy, unspecified: Secondary | ICD-10-CM | POA: Insufficient documentation

## 2015-07-16 DIAGNOSIS — E0580 Other thyrotoxicosis without thyrotoxic crisis or storm: Secondary | ICD-10-CM | POA: Diagnosis present

## 2015-07-16 DIAGNOSIS — K5732 Diverticulitis of large intestine without perforation or abscess without bleeding: Secondary | ICD-10-CM

## 2015-07-16 DIAGNOSIS — I10 Essential (primary) hypertension: Secondary | ICD-10-CM

## 2015-07-16 DIAGNOSIS — A8100 Creutzfeldt-Jakob disease, unspecified: Secondary | ICD-10-CM | POA: Diagnosis present

## 2015-07-16 LAB — MAGNESIUM: MAGNESIUM: 1.9 mg/dL (ref 1.7–2.4)

## 2015-07-16 LAB — CBC
HCT: 36.9 % (ref 36.0–46.0)
Hemoglobin: 11.7 g/dL — ABNORMAL LOW (ref 12.0–15.0)
MCH: 27.7 pg (ref 26.0–34.0)
MCHC: 31.7 g/dL (ref 30.0–36.0)
MCV: 87.2 fL (ref 78.0–100.0)
PLATELETS: 240 10*3/uL (ref 150–400)
RBC: 4.23 MIL/uL (ref 3.87–5.11)
RDW: 13.4 % (ref 11.5–15.5)
WBC: 10.2 10*3/uL (ref 4.0–10.5)

## 2015-07-16 LAB — BASIC METABOLIC PANEL
Anion gap: 10 (ref 5–15)
BUN: 14 mg/dL (ref 6–20)
CALCIUM: 8.5 mg/dL — AB (ref 8.9–10.3)
CO2: 22 mmol/L (ref 22–32)
CREATININE: 0.98 mg/dL (ref 0.44–1.00)
Chloride: 110 mmol/L (ref 101–111)
GFR calc non Af Amer: 54 mL/min — ABNORMAL LOW (ref >60)
Glucose, Bld: 118 mg/dL — ABNORMAL HIGH (ref 65–99)
Potassium: 3.2 mmol/L — ABNORMAL LOW (ref 3.5–5.1)
SODIUM: 142 mmol/L (ref 135–145)

## 2015-07-16 LAB — HEPARIN LEVEL (UNFRACTIONATED): Heparin Unfractionated: 0.15 IU/mL — ABNORMAL LOW (ref 0.30–0.70)

## 2015-07-16 LAB — TSH: TSH: 0.069 u[IU]/mL — ABNORMAL LOW (ref 0.350–4.500)

## 2015-07-16 LAB — T4, FREE: Free T4: 1.22 ng/dL — ABNORMAL HIGH (ref 0.61–1.12)

## 2015-07-16 LAB — PROTIME-INR
INR: 1.3 (ref 0.00–1.49)
PROTHROMBIN TIME: 16.3 s — AB (ref 11.6–15.2)

## 2015-07-16 MED ORDER — LEVOTHYROXINE SODIUM 100 MCG PO TABS
100.0000 ug | ORAL_TABLET | Freq: Every day | ORAL | Status: DC
  Administered 2015-07-17 – 2015-07-20 (×4): 100 ug via ORAL
  Filled 2015-07-16 (×4): qty 1

## 2015-07-16 MED ORDER — POTASSIUM CHLORIDE CRYS ER 20 MEQ PO TBCR
40.0000 meq | EXTENDED_RELEASE_TABLET | ORAL | Status: AC
  Administered 2015-07-16 (×2): 40 meq via ORAL
  Filled 2015-07-16 (×2): qty 2

## 2015-07-16 MED ORDER — METOPROLOL TARTRATE 25 MG PO TABS
25.0000 mg | ORAL_TABLET | Freq: Two times a day (BID) | ORAL | Status: DC
  Administered 2015-07-16 – 2015-07-17 (×4): 25 mg via ORAL
  Filled 2015-07-16 (×4): qty 1

## 2015-07-16 MED ORDER — SODIUM CHLORIDE 0.9 % IV SOLN
INTRAVENOUS | Status: DC
  Administered 2015-07-16 – 2015-07-17 (×2): via INTRAVENOUS

## 2015-07-16 NOTE — Progress Notes (Signed)
Report received in patient's room via Josh RN, using SBAR format, reviewed orders, labs, VS, meds and patient's general condition, assumed care of patient.

## 2015-07-16 NOTE — Progress Notes (Signed)
SUBJECTIVE: The patient is oriented only to self (baseline) and denies any complaints at this time  . levothyroxine  125 mcg Oral QAC breakfast  . metoprolol  5 mg Intravenous 3 times per day  . piperacillin-tazobactam (ZOSYN)  IV  3.375 g Intravenous 3 times per day  . potassium chloride  40 mEq Oral Q2H  . simvastatin  20 mg Oral q1800   . heparin 1,150 Units/hr (07/16/15 0505)    OBJECTIVE: Physical Exam: Filed Vitals:   07/16/15 0300 07/16/15 0348 07/16/15 0400 07/16/15 0800  BP:   141/78   Pulse: 69  83   Temp:  98.7 F (37.1 C)  98.4 F (36.9 C)  TempSrc:  Oral  Oral  Resp: 33  33   Height:      Weight:  162 lb 7.7 oz (73.7 kg)    SpO2: 96%  98%     Intake/Output Summary (Last 24 hours) at 07/16/15 0932 Last data filed at 07/16/15 0800  Gross per 24 hour  Intake    360 ml  Output   2050 ml  Net  -1690 ml    Telemetry reveals AFib, generally 100-110   GEN- The patient is well appearing, alert and oriented only to self.   Head- normocephalic, atraumatic Eyes-  Sclera clear, conjunctiva pink Ears- hearing intact Oropharynx- clear Neck- supple, no JVP Lungs- Clear to ausculation bilaterally, normal work of breathing Heart- irregular rate and rhythm, no significant murmurs, no rubs or gallops GI- soft, NT, ND Extremities- no clubbing, cyanosis, or edema Skin- no rash or lesion Psych- euthymic mood, full affect Neuro- no gross deficits appreciated  LABS: Basic Metabolic Panel:  Recent Labs  81/19/14 2218 07/14/15 2259 07/15/15 0132 07/15/15 0849 07/16/15 0317  NA 136 138  --   --  142  K 4.1 4.1  --   --  3.2*  CL 104 103  --   --  110  CO2 21*  --   --   --  22  GLUCOSE 160* 147*  --   --  118*  BUN 38* 38*  --   --  14  CREATININE 1.44* 1.40*  --   --  0.98  CALCIUM 9.2  --   --   --  8.5*  MG  --   --  1.7  --   --   PHOS  --   --   --  2.4*  --    Liver Function Tests:  Recent Labs  07/14/15 2218  AST 30  ALT 16  ALKPHOS 54    BILITOT 0.6  PROT 6.1*  ALBUMIN 3.6    Recent Labs  07/14/15 2218  LIPASE 31   CBC:  Recent Labs  07/14/15 2218 07/14/15 2259 07/16/15 0317  WBC 11.8*  --  10.2  NEUTROABS 10.1*  --   --   HGB 11.9* 12.9 11.7*  HCT 35.6* 38.0 36.9  MCV 87.5  --  87.2  PLT 215  --  240   Cardiac Enzymes:  Recent Labs  07/15/15 0849  TROPONINI <0.03      ASSESSMENT AND PLAN:   1. Abdominal c/o, diarrhea  Possible mild diverticulitis by CT scan, possible cholecystitis not appreciated on [place]  c/w medicine service  2. Hypotension  S/p IVF bolus  remains resolved  3. AFib with RVR, in setting of acute abdominal process  CHA2DS2Vasc is at least 7 on warfarin at home The patient's daughter stated the patient[facility] have discontinued her warfarin with concerns about her progressing dimentia and fall/injury risk      Will stop the heparin and continue rate control strategy she is taking PO and will change to PO lopressor, can up-titrate lopressor for HR control as needed as BP allows.  4. Dementia  reported as rapidly progressing by her daughter   Electrophysiology team to see as needed while here. Please call with questions.   Francis Dowseenee Ursuy, PA-C  EP Attending  Patient seen and examined. She is fairly agitated today. "leave me alone", but denies pain. Exam notable for IRIR tachy with scattered rales but no increased work of breathing, extremities with trace edema. Neuro with generalized confusion. Tele - atrial fib with a RVR A/P 1. Atrial fib - her rate is increased. She is not an anti-coagulation candidate. Would stop heparin. Uptitrate her meds as tolerated. 2. coags - she is not a candidate for anti-coagualation. 3. Dementia - her prognosis is poor as it appears that this is a rapidly declining situation.  Tashala Cumbo,M.D.  07/16/2015 9:32 AM

## 2015-07-16 NOTE — Clinical Documentation Improvement (Signed)
Hospitalist  Can the diagnosis of CKD be further specified?   CKD Stage I - GFR greater than or equal to 90  CKD Stage II - GFR 60-89  CKD Stage III - GFR 30-59  CKD Stage IV - GFR 15-29  CKD Stage V - GFR < 15  ESRD (End Stage Renal Disease)  Other condition  Unable to clinically determine   Supporting Information: : (risk factors, signs and symptoms, diagnostics, treatment) History of CKD per 4/13 progress notes. Labs:   Bun     Creat     GFR: 4/14:    14        0.98        54 4/12:    38        1.44        34  Please exercise your independent, professional judgment when responding. A specific answer is not anticipated or expected.   Thank Rhonda DonovanYou, [name] [facility] [employer] [place] [phone]

## 2015-07-16 NOTE — Progress Notes (Signed)
[facility] Progress Note  Patient: Rhonda Graham ZOX:[id number]   PCP: Rozanna Box, MD DOB: 09/27/1934   DOA: 07/14/2015   DOS: 07/16/2015   Date of Service: the patient was seen and examined on 07/16/2015  Subjective: The patient remains confused and is unable to converse in meaningful sentences. No acute events identified overnight. Review of system is limited Nutrition: Tolerating clear liquids  Brief hospital course: Patient was admitted on 07/14/2015, with complaint of abdominal pain and diarrhea. Patient was worked up in the ER extensively. She developed A. fib with RVR with hypotension and cardiology was consulted in the ER. She also underwent an attempted cardioversion in the ER. Critical care was consulted and felt that the patient can be safely admitted to step down unit and hospitalist was consulted for the admission. Currently the diarrhea is improving as well as abdominal pain. Heart rate is also stabilizing Currently further plan is continue antibiotics for the treatment of diverticulitis.  Assessment and Plan: 1. Diverticulitis Patient presented with abdominal pain and diarrhea. CT scan of the abdomen shows evidence of sigmoid diverticulitis without any perforation or abscess. Ultrasound of the abdomen is negative for any evidence of gallbladder infection. Leukocytosis has resolved. Renal function is also stable. With this continue Cipro and Flagyl. Continue clear liquid diet. Monitor clinically.  2. A. fib with RVR. CHA2DS2-VASc Score 6. Not on any anticoagulation due to dementia and high fall risk. Patient was initially started on heparin which have been discontinued. Patient was initially treated with adenosine without any improvement. Patient was also treated with cardioversion without any improvement. Later on she was given amiodarone bolus and was started on amiodarone drip. Amiodarone drip was discontinued and current recommendation from EP is rate  control. Continue Lopressor for rate control at present.  3. Hypothyroidism. Iatrogenic hyperthyroidism. Patient is on levothyroxine 125 g. A T4 elevated free T4 suppressed. I would reduce the dose of levothyroxine 100 g. Recheck in one month.  4. CJD Dementia with delirium. Currently stable no evidence of acute abnormality. Resume Seroquel. Check MRI of the brain to rule out any acute intracranial abnormality.  5. Hypokalemia. Replacing orally. Magnesium level stable. We will recheck tomorrow.  Activity: physical therapy consulted Bowel regimen: last BM prior to arrival DVT Prophylaxis: subcutaneous Heparin Nutrition: Clear liquid diet Advance goals of care discussion: Partial code DO NOT INTUBATE confirmed with patient's POA  Procedures: none Consultants: Cardiology, electrophysiology, critical care. Antibiotics: Anti-infectives    Start     Dose/Rate Route Frequency Ordered Stop   07/15/15 1600  piperacillin-tazobactam (ZOSYN) IVPB 3.375 g     3.375 g 12.5 mL/hr over 240 Minutes Intravenous 3 times per day 07/15/15 1345     07/15/15 1600  metroNIDAZOLE (FLAGYL) IVPB 500 mg  Status:  Discontinued     500 mg 100 mL/hr over 60 Minutes Intravenous Every 8 hours 07/15/15 1345 07/16/15 0921   07/15/15 0800  metroNIDAZOLE (FLAGYL) IVPB 500 mg     500 mg 100 mL/hr over 60 Minutes Intravenous  Once 07/15/15 0753 07/15/15 1120   07/15/15 0500  piperacillin-tazobactam (ZOSYN) IVPB 3.375 g     3.375 g 100 mL/hr over 30 Minutes Intravenous  Once 07/15/15 0456 07/15/15 8119       Family Communication: family was present at bedside, at the time of interview.  Opportunity was given to ask question and all questions were answered satisfactorily.   Disposition:  Expected discharge date: 07/20/2015 Barriers to safe discharge: Improvement in diverticulitis  Intake/Output Summary (Last 24 hours) at 07/16/15 1636 Last data filed at 07/16/15 0800  Gross per 24 hour  Intake  710.11 ml  Output   2050 ml  Net -1339.89 ml   Filed Weights   07/15/15 1100 07/15/15 1330 07/16/15 0348  Weight: 78.7 kg (173 lb 8 oz) 76.114 kg (167 lb 12.8 oz) 73.7 kg (162 lb 7.7 oz)    Objective: Physical Exam: Filed Vitals:   07/16/15 0300 07/16/15 0348 07/16/15 0400 07/16/15 0800  BP:   141/78 123/62  Pulse: 69  83 81  Temp:  98.7 F (37.1 C)  98.4 F (36.9 C)  TempSrc:  Oral  Oral  Resp: 33  33 18  Height:      Weight:  73.7 kg (162 lb 7.7 oz)    SpO2: 96%  98% 98%     General: Appear in moderate distress, no Rash; Oral Mucosa moist. Cardiovascular: S1 and S2 Present, no Murmur,  Respiratory: Bilateral Air entry present and Clear to Auscultation, no Crackles, no wheezes Abdomen: Bowel Sound present, Soft and mild tenderness Extremities: no Pedal edema, no calf tenderness Neurology: Grossly no focal neuro deficit. Difficult and disorientation having difficulty following command  Data Reviewed: CBC:  Recent Labs Lab 07/14/15 2218 07/14/15 2259 07/16/15 0317  WBC 11.8*  --  10.2  NEUTROABS 10.1*  --   --   HGB 11.9* 12.9 11.7*  HCT 35.6* 38.0 36.9  MCV 87.5  --  87.2  PLT 215  --  240   Basic Metabolic Panel:  Recent Labs Lab 07/14/15 2218 07/14/15 2259 07/15/15 0132 07/15/15 0849 07/16/15 0317 07/16/15 0359  NA 136 138  --   --  142  --   K 4.1 4.1  --   --  3.2*  --   CL 104 103  --   --  110  --   CO2 21*  --   --   --  22  --   GLUCOSE 160* 147*  --   --  118*  --   BUN 38* 38*  --   --  14  --   CREATININE 1.44* 1.40*  --   --  0.98  --   CALCIUM 9.2  --   --   --  8.5*  --   MG  --   --  1.7  --   --  1.9  PHOS  --   --   --  2.4*  --   --    Liver Function Tests:  Recent Labs Lab 07/14/15 2218  AST 30  ALT 16  ALKPHOS 54  BILITOT 0.6  PROT 6.1*  ALBUMIN 3.6    Recent Labs Lab 07/14/15 2218  LIPASE 31   No results for input(s): AMMONIA in the last 168 hours.  Cardiac Enzymes:  Recent Labs Lab 07/15/15 0849   TROPONINI <0.03    BNP (last 3 results) No results for input(s): BNP in the last 8760 hours.  CBG: No results for input(s): GLUCAP in the last 168 hours.  Recent Results (from the past 240 hour(s))  MRSA PCR Screening     Status: None   Collection Time: 07/15/15  2:11 PM  Result Value Ref Range Status   MRSA by PCR NEGATIVE NEGATIVE Final    Comment:        The GeneXpert MRSA Assay (FDA approved for NASAL specimens only), is one component of a comprehensive MRSA colonization surveillance program. It is not intended  to diagnose MRSA infection nor to guide or monitor treatment for MRSA infections.      Studies: No results found.   Scheduled Meds: . [START ON 07/17/2015] levothyroxine  100 mcg Oral QAC breakfast  . metoprolol tartrate  25 mg Oral BID  . piperacillin-tazobactam (ZOSYN)  IV  3.375 g Intravenous 3 times per day  . simvastatin  20 mg Oral q1800   Continuous Infusions:  PRN Meds: acetaminophen, ondansetron (ZOFRAN) IV, zolpidem  Time spent: 30 minutes  Author: Lynden OxfordPranav Oluwanifemi Susman, MD [facility] Pager: [phone] 07/16/2015 4:36 PM  If 7PM-7AM, please contact night-coverage at [email or web address], password [email or web address]

## 2015-07-16 NOTE — Progress Notes (Signed)
Patient was laying in a puddle of dark green/brown liquid stool, turned to clean her up and she started having stool running out of her rectum, stool sample obtained and patient was cleaned up and peri/foley care done but she continues to leak stool, notified charge RN Earley AbideHilda and she placed a text page to the MD for a fecal management system, will order and place when it arrives on unit. Patient's IV unable to flush or pull back, removed the dressing and attempted to readjust the catheter without success, will page IV team for a new line and will then start her IV fluids, will continue to monitor.

## 2015-07-16 NOTE — Progress Notes (Signed)
ANTICOAGULATION Follow up NOTE  Pharmacy Consult for heparin Indication: atrial fibrillation  No Known Allergies  Patient Measurements: Height: 5\' 6"  (167.6 cm) Weight: 162 lb 7.7 oz (73.7 kg) IBW/kg (Calculated) : 59.3 Heparin Dosing Weight: 75kg  Vital Signs: Temp: 98.7 F (37.1 C) (04/14 0348) Temp Source: Oral (04/14 0348) BP: 141/78 mmHg (04/14 0400) Pulse Rate: 83 (04/14 0400)  Labs:  Recent Labs  07/14/15 2218 07/14/15 2259 07/15/15 0849 07/15/15 1845 07/16/15 0317  HGB 11.9* 12.9  --   --  11.7*  HCT 35.6* 38.0  --   --  36.9  PLT 215  --   --   --  240  LABPROT  --   --  15.1  --  16.3*  INR  --   --  1.17  --  1.30  HEPARINUNFRC  --   --   --  0.39 0.15*  CREATININE 1.44* 1.40*  --   --   --   TROPONINI  --   --  <0.03  --   --     Estimated Creatinine Clearance: 34 mL/min (by C-G formula based on Cr of 1.4).  Assessment: [age] YOF here with abdominal pain and diarrhea for several days. With history of AFib, went into AFib with RVR and started on amiodarone drip. To start heparin.  Per last medication history list, she was on warfarin however we are awaiting family arrival to confirm most recent list as patient has dementia. INR this morning is normal at 1.17. Hgb 11.7, plts 240- no bleeding noted.  Current HL 0.15 on heparin gtt at 950 units/hr (previously level 0.39), no issues with heparin per discussion with RN.  Goal of Therapy:  Heparin level 0.3-0.7 units/ml Monitor platelets by anticoagulation protocol: Yes   Plan:  1. Increase heparin to 1150 units/hr 2. HL in 8 hours 3. Daily HL and CBC 4. Monitor s/sx of bleeding  Pollyann SamplesAndy Tylor Gambrill, PharmD, BCPS 07/16/2015, 4:41 AM Pager: [phone]

## 2015-07-17 DIAGNOSIS — A09 Infectious gastroenteritis and colitis, unspecified: Secondary | ICD-10-CM

## 2015-07-17 DIAGNOSIS — N183 Chronic kidney disease, stage 3 (moderate): Secondary | ICD-10-CM

## 2015-07-17 DIAGNOSIS — E785 Hyperlipidemia, unspecified: Secondary | ICD-10-CM

## 2015-07-17 LAB — COMPREHENSIVE METABOLIC PANEL
ALBUMIN: 3 g/dL — AB (ref 3.5–5.0)
ALK PHOS: 50 U/L (ref 38–126)
ALT: 20 U/L (ref 14–54)
ANION GAP: 9 (ref 5–15)
AST: 24 U/L (ref 15–41)
BILIRUBIN TOTAL: 1.1 mg/dL (ref 0.3–1.2)
BUN: 10 mg/dL (ref 6–20)
CALCIUM: 8.8 mg/dL — AB (ref 8.9–10.3)
CO2: 22 mmol/L (ref 22–32)
Chloride: 110 mmol/L (ref 101–111)
Creatinine, Ser: 0.86 mg/dL (ref 0.44–1.00)
Glucose, Bld: 115 mg/dL — ABNORMAL HIGH (ref 65–99)
POTASSIUM: 4.1 mmol/L (ref 3.5–5.1)
Sodium: 141 mmol/L (ref 135–145)
TOTAL PROTEIN: 5.7 g/dL — AB (ref 6.5–8.1)

## 2015-07-17 LAB — CBC WITH DIFFERENTIAL/PLATELET
BASOS ABS: 0.1 10*3/uL (ref 0.0–0.1)
BASOS PCT: 1 %
Eosinophils Absolute: 0.2 10*3/uL (ref 0.0–0.7)
Eosinophils Relative: 2 %
HEMATOCRIT: 36.6 % (ref 36.0–46.0)
Hemoglobin: 12 g/dL (ref 12.0–15.0)
LYMPHS PCT: 16 %
Lymphs Abs: 1.7 10*3/uL (ref 0.7–4.0)
MCH: 29 pg (ref 26.0–34.0)
MCHC: 32.8 g/dL (ref 30.0–36.0)
MCV: 88.4 fL (ref 78.0–100.0)
Monocytes Absolute: 1.1 10*3/uL — ABNORMAL HIGH (ref 0.1–1.0)
Monocytes Relative: 10 %
NEUTROS ABS: 7.7 10*3/uL (ref 1.7–7.7)
NEUTROS PCT: 71 %
Platelets: 232 10*3/uL (ref 150–400)
RBC: 4.14 MIL/uL (ref 3.87–5.11)
RDW: 13.6 % (ref 11.5–15.5)
WBC: 10.7 10*3/uL — AB (ref 4.0–10.5)

## 2015-07-17 LAB — C DIFFICILE QUICK SCREEN W PCR REFLEX
C DIFFICLE (CDIFF) ANTIGEN: NEGATIVE
C Diff interpretation: NEGATIVE
C Diff toxin: NEGATIVE

## 2015-07-17 NOTE — Progress Notes (Signed)
[facility] Progress Note  Patient: Rhonda Graham ZOX:[id number]   PCP: Rozanna Box, MD DOB: [birth year]   DOA: 07/14/2015   DOS: 07/17/2015   Date of Service: the patient was seen and examined on 07/17/2015  Subjective: review of the system. Continues to remain limited due to patient's memory issue. No acute event identified overnight. A rectal tube and a Foley catheter was inserted due to incontinence Nutrition: Tolerating clear liquids  Brief hospital course: Patient was admitted on 07/14/2015, with complaint of abdominal pain and diarrhea. Patient was worked up in the ER extensively. She developed A. fib with RVR with hypotension and cardiology was consulted in the ER. She also underwent an attempted cardioversion in the ER. Critical care was consulted and felt that the patient can be safely admitted to step down unit and hospitalist was consulted for the admission. Currently the diarrhea is improving as well as abdominal pain. Heart rate is also stabilizing Currently further plan is continue antibiotics for the treatment of diverticulitis.  Assessment and Plan: 1. Diverticulitis Patient presented with abdominal pain and diarrhea. CT scan of the abdomen shows evidence of sigmoid diverticulitis without any perforation or abscess. Ultrasound of the abdomen is negative for any evidence of gallbladder infection. Leukocytosis has resolved. Renal function is also stable. With this continue Cipro and Flagyl. Advance to full liquid diet. Monitor clinically.  2. A. fib with RVR. CHA2DS2-VASc Score 6. Not on any anticoagulation due to dementia and high fall risk. Continue Lopressor for rate control.  Patient was initially started on heparin which have been discontinued. Patient was initially treated with adenosine without any improvement. Patient was also treated with cardioversion without any improvement. Later on she was given amiodarone bolus and was started on amiodarone  drip. Amiodarone drip was discontinued and current recommendation from EP is rate control. Continue Lopressor for rate control at present.  3. Hypothyroidism. Iatrogenic hyperthyroidism. Patient is on levothyroxine 125 g. A T4 elevated free tsh suppressed. I would reduce the dose of levothyroxine 100 g. Recheck in one month.  4. CJD Dementia with delirium. Currently stable no evidence of acute abnormality. Resume Seroquel. MRI of the brain rule out any acute intracranial abnormality.  5. Hypokalemia. Stable  Magnesium level stable.  Activity: physical therapy consulted Bowel regimen: last BM 07/17/2015 DVT Prophylaxis: subcutaneous Heparin Nutrition: full liquid diet Advance goals of care discussion: Partial code DO NOT INTUBATE confirmed with patient's POA  Procedures: none Consultants: Cardiology, electrophysiology, critical care. Antibiotics: Anti-infectives    Start     Dose/Rate Route Frequency Ordered Stop   07/15/15 1600  piperacillin-tazobactam (ZOSYN) IVPB 3.375 g     3.375 g 12.5 mL/hr over 240 Minutes Intravenous 3 times per day 07/15/15 1345     07/15/15 1600  metroNIDAZOLE (FLAGYL) IVPB 500 mg  Status:  Discontinued     500 mg 100 mL/hr over 60 Minutes Intravenous Every 8 hours 07/15/15 1345 07/16/15 0921   07/15/15 0800  metroNIDAZOLE (FLAGYL) IVPB 500 mg     500 mg 100 mL/hr over 60 Minutes Intravenous  Once 07/15/15 0753 07/15/15 1120   07/15/15 0500  piperacillin-tazobactam (ZOSYN) IVPB 3.375 g     3.375 g 100 mL/hr over 30 Minutes Intravenous  Once 07/15/15 0456 07/15/15 8119      Family Communication: family was present at bedside, at the time of interview.  Opportunity was given to ask question and all questions were answered satisfactorily.   Disposition:  Expected discharge date: 07/20/2015 Barriers to safe  discharge: Improvement in diverticulitis   Intake/Output Summary (Last 24 hours) at 07/17/15 1753 Last data filed at 07/17/15 1230   Gross per 24 hour  Intake 2320.13 ml  Output    125 ml  Net 2195.13 ml   Filed Weights   07/15/15 1330 07/16/15 0348 07/17/15 0529  Weight: 76.114 kg (167 lb 12.8 oz) 73.7 kg (162 lb 7.7 oz) 75 kg (165 lb 5.5 oz)    Objective: Physical Exam: Filed Vitals:   07/17/15 0529 07/17/15 0800 07/17/15 0813 07/17/15 1249  BP:  122/66 112/84 118/90  Pulse:   107 110  Temp:   98.2 F (36.8 C) 98.3 F (36.8 C)  TempSrc:   Oral Oral  Resp:  34 20 21  Height:      Weight: 75 kg (165 lb 5.5 oz)     SpO2:   98% 98%    General: Appear in moderate distress, no Rash; Oral Mucosa moist. Cardiovascular: S1 and S2 Present, no Murmur,  Respiratory: Bilateral Air entry present and Clear to Auscultation, no Crackles, no wheezes Abdomen: Bowel Sound present, Soft and mild tenderness Extremities: no Pedal edema, no calf tenderness Neurology: Grossly no focal neuro deficit. Difficult exam due to disorientation having difficulty following command  Data Reviewed: CBC:  Recent Labs Lab 07/14/15 2218 07/14/15 2259 07/16/15 0317 07/17/15 0407  WBC 11.8*  --  10.2 10.7*  NEUTROABS 10.1*  --   --  7.7  HGB 11.9* 12.9 11.7* 12.0  HCT 35.6* 38.0 36.9 36.6  MCV 87.5  --  87.2 88.4  PLT 215  --  240 232   Basic Metabolic Panel:  Recent Labs Lab 07/14/15 2218 07/14/15 2259 07/15/15 0132 07/15/15 0849 07/16/15 0317 07/16/15 0359 07/17/15 0407  NA 136 138  --   --  142  --  141  K 4.1 4.1  --   --  3.2*  --  4.1  CL 104 103  --   --  110  --  110  CO2 21*  --   --   --  22  --  22  GLUCOSE 160* 147*  --   --  118*  --  115*  BUN 38* 38*  --   --  14  --  10  CREATININE 1.44* 1.40*  --   --  0.98  --  0.86  CALCIUM 9.2  --   --   --  8.5*  --  8.8*  MG  --   --  1.7  --   --  1.9  --   PHOS  --   --   --  2.4*  --   --   --    Liver Function Tests:  Recent Labs Lab 07/14/15 2218 07/17/15 0407  AST 30 24  ALT 16 20  ALKPHOS 54 50  BILITOT 0.6 1.1  PROT 6.1* 5.7*  ALBUMIN 3.6 3.0*     Recent Labs Lab 07/14/15 2218  LIPASE 31   No results for input(s): AMMONIA in the last 168 hours.  Cardiac Enzymes:  Recent Labs Lab 07/15/15 0849  TROPONINI <0.03    BNP (last 3 results) No results for input(s): BNP in the last 8760 hours.  CBG: No results for input(s): GLUCAP in the last 168 hours.  Recent Results (from the past 240 hour(s))  MRSA PCR Screening     Status: None   Collection Time: 07/15/15  2:11 PM  Result Value Ref Range Status   MRSA  by PCR NEGATIVE NEGATIVE Final    Comment:        The GeneXpert MRSA Assay (FDA approved for NASAL specimens only), is one component of a comprehensive MRSA colonization surveillance program. It is not intended to diagnose MRSA infection nor to guide or monitor treatment for MRSA infections.   C difficile quick scan w PCR reflex     Status: None   Collection Time: 07/17/15  1:11 PM  Result Value Ref Range Status   C Diff antigen NEGATIVE NEGATIVE Final   C Diff toxin NEGATIVE NEGATIVE Final   C Diff interpretation Negative for toxigenic C. difficile  Final     Studies: Mr Brain Wo Contrast  07/16/2015  CLINICAL DATA:  Confusion. Abdominal pain and diarrhea. Treatment for diverticulitis. EXAM: MRI HEAD WITHOUT CONTRAST TECHNIQUE: Multiplanar, multiecho pulse sequences of the brain and surrounding structures were obtained without intravenous contrast. COMPAR[facility]  [facility] FINDINGS: Markedly abnormal diffusion signal in the brain, widespread ribbon like cortical involvement asymmetrically affecting the LEFT hemisphere. Equal and continuous involvement of the anterior, middle, and posterior cerebral artery vascular territories is indicative of a non vascular/ non arterial phenomenon. Much smaller areas of involvement in the RIGHT hemisphere including the medial parietal lobe, anterior RIGHT frontal lobe, also lateral superior temporal lobe. Medial thalamic involvement noted previously is  less evident but present. Slight progression from priors, particularly with regard to involvement of the RIGHT hemisphere. New No mass lesion, hydrocephalus, or extra-axial fluid. Global atrophy. Moderately extensive small vessel disease. Flow voids are maintained. Normal pituitary and cerebellar tonsils. No hemorrhage. Prominent perivascular spaces. Conjugate gaze. No sinus or mastoid fluid. IMPRESSION: Although rare, the extensive abnormal diffusion signal with widespread cortical involvement and deep gray matter favors Creutzfeldt-Jacob disease. See discussion above. Mild progression, with regard to increasing involvement of the RIGHT hemisphere, from most recent brain MRI dated 03/24/2015 from outside institution. That study was performed without and with contrast, and there was no enhancement, also characteristic of CJD. Atrophy and small vessel disease. A call is in to the ordering provider. Electronically Signed   By: Elsie StainJohn T Curnes M.D.   On: 07/16/2015 18:44     Scheduled Meds: . levothyroxine  100 mcg Oral QAC breakfast  . metoprolol tartrate  25 mg Oral BID  . piperacillin-tazobactam (ZOSYN)  IV  3.375 g Intravenous 3 times per day  . simvastatin  20 mg Oral q1800   Continuous Infusions: . sodium chloride 50 mL/hr at 07/16/15 2130   PRN Meds: acetaminophen, ondansetron (ZOFRAN) IV, zolpidem  Time spent: 30 minutes  Author: Lynden OxfordPranav Lekeshia Kram, MD [facility] Pager: [phone] 07/17/2015 5:53 PM  If 7PM-7AM, please contact night-coverage at [email or web address], password [email or web address]

## 2015-07-17 NOTE — Progress Notes (Signed)
Patient was lying in a pool of green/brown stool, previous orders were given to place a fecal management bag and after she was cleaned it was inserted using lubricant and then 45cc warm water placed in the bulb to help keep it intact and irrigated with 60cc of warm water, patient tolerated well, foley care done along with pericare and linens changed and patient repositioned without difficulty, will continue to monitor. Annabelle HarmanDana is a NT sitting with patient to keep her from falling OOB.

## 2015-07-18 LAB — GASTROINTESTINAL PANEL BY PCR, STOOL (REPLACES STOOL CULTURE)
Adenovirus F40/41: NOT DETECTED
Astrovirus: NOT DETECTED
CAMPYLOBACTER SPECIES: NOT DETECTED
CRYPTOSPORIDIUM: NOT DETECTED
Cyclospora cayetanensis: NOT DETECTED
E. COLI O157: NOT DETECTED
Entamoeba histolytica: NOT DETECTED
Enteroaggregative E coli (EAEC): NOT DETECTED
Enteropathogenic E coli (EPEC): NOT DETECTED
Enterotoxigenic E coli (ETEC): NOT DETECTED
Giardia lamblia: NOT DETECTED
Norovirus GI/GII: NOT DETECTED
PLESIMONAS SHIGELLOIDES: NOT DETECTED
ROTAVIRUS A: NOT DETECTED
SALMONELLA SPECIES: NOT DETECTED
SHIGA LIKE TOXIN PRODUCING E COLI (STEC): NOT DETECTED
SHIGELLA/ENTEROINVASIVE E COLI (EIEC): NOT DETECTED
Sapovirus (I, II, IV, and V): NOT DETECTED
Vibrio cholerae: NOT DETECTED
Vibrio species: NOT DETECTED
YERSINIA ENTEROCOLITICA: NOT DETECTED

## 2015-07-18 LAB — MAGNESIUM: MAGNESIUM: 1.5 mg/dL — AB (ref 1.7–2.4)

## 2015-07-18 LAB — CBC
HCT: 37.1 % (ref 36.0–46.0)
HEMOGLOBIN: 11.9 g/dL — AB (ref 12.0–15.0)
MCH: 28.2 pg (ref 26.0–34.0)
MCHC: 32.1 g/dL (ref 30.0–36.0)
MCV: 87.9 fL (ref 78.0–100.0)
PLATELETS: 222 10*3/uL (ref 150–400)
RBC: 4.22 MIL/uL (ref 3.87–5.11)
RDW: 13.5 % (ref 11.5–15.5)
WBC: 10.2 10*3/uL (ref 4.0–10.5)

## 2015-07-18 LAB — BASIC METABOLIC PANEL
ANION GAP: 10 (ref 5–15)
BUN: 9 mg/dL (ref 6–20)
CHLORIDE: 112 mmol/L — AB (ref 101–111)
CO2: 20 mmol/L — ABNORMAL LOW (ref 22–32)
Calcium: 8.6 mg/dL — ABNORMAL LOW (ref 8.9–10.3)
Creatinine, Ser: 1.03 mg/dL — ABNORMAL HIGH (ref 0.44–1.00)
GFR, EST AFRICAN AMERICAN: 59 mL/min — AB (ref >60)
GFR, EST NON AFRICAN AMERICAN: 51 mL/min — AB (ref >60)
Glucose, Bld: 112 mg/dL — ABNORMAL HIGH (ref 65–99)
POTASSIUM: 4 mmol/L (ref 3.5–5.1)
SODIUM: 142 mmol/L (ref 135–145)

## 2015-07-18 MED ORDER — MAGNESIUM SULFATE 2 GM/50ML IV SOLN
2.0000 g | Freq: Once | INTRAVENOUS | Status: AC
  Administered 2015-07-18: 2 g via INTRAVENOUS
  Filled 2015-07-18: qty 50

## 2015-07-18 MED ORDER — SACCHAROMYCES BOULARDII 250 MG PO CAPS
250.0000 mg | ORAL_CAPSULE | Freq: Two times a day (BID) | ORAL | Status: DC
  Administered 2015-07-18 – 2015-07-20 (×5): 250 mg via ORAL
  Filled 2015-07-18 (×5): qty 1

## 2015-07-18 MED ORDER — METOPROLOL TARTRATE 50 MG PO TABS
50.0000 mg | ORAL_TABLET | Freq: Two times a day (BID) | ORAL | Status: DC
  Administered 2015-07-18 – 2015-07-20 (×5): 50 mg via ORAL
  Filled 2015-07-18 (×6): qty 1

## 2015-07-18 NOTE — Evaluation (Signed)
Physical Therapy Evaluation Patient Details Name: Rhonda Graham MRN: [id number] DOB: [birth year] Today's Date: 07/18/2015   History of Present Illness  Pt adm with diverticulitis and afib. PMH-  Creutzfeldt-Jakob disease, HTN, CVA  Clinical Impression  Pt admitted with above diagnosis and presents to PT with functional limitations due to deficits listed below (See PT problem list). Pt needs skilled PT to maximize independence and safety to allow discharge to a memory care unit vs SNF. Unsure of pt's mobility status prior to hospitalization and gait today very limited by flexiseal leaking.      Follow Up Recommendations SNF (vs memory care at ALF)    Equipment Recommendations  Other (comment) (To be assessed)    Recommendations for Other Services       Precautions / Restrictions Precautions Precautions: Fall      Mobility  Bed Mobility Overal bed mobility: Needs Assistance Bed Mobility: Supine to Sit;Sit to Supine     Supine to sit: Mod assist Sit to supine: Mod assist   General bed mobility comments: Assist primarily due to cognition  Transfers Overall transfer level: Needs assistance Equipment used: Rolling walker (2 wheeled) Transfers: Sit to/from Stand Sit to Stand: Min assist         General transfer comment: Assist to initiate and for balance  Ambulation/Gait Ambulation/Gait assistance: Min assist Ambulation Distance (Feet): 3 Feet Assistive device: Rolling walker (2 wheeled)       General Gait Details: Only sidestepped up side of bed due to flexiseal leaking  Stairs            Wheelchair Mobility    Modified Rankin (Stroke Patients Only)       Balance Overall balance assessment: Needs assistance Sitting-balance support: No upper extremity supported;Feet supported Sitting balance-Leahy Scale: Good     Standing balance support: No upper extremity supported Standing balance-Leahy Scale: Fair                                Pertinent Vitals/Pain Pain Assessment: Faces Faces Pain Scale: No hurt    Home Living Family/patient expects to be discharged to:: Unsure Living Arrangements: Children                    Prior Function           Comments: Unsure. Family not present     Hand Dominance        Extremity/Trunk Assessment   Upper Extremity Assessment: Difficult to assess due to impaired cognition (Active movement noted throughout)           Lower Extremity Assessment: Difficult to assess due to impaired cognition (Active movement noted throughout)         Communication      Cognition Arousal/Alertness: Awake/alert Behavior During Therapy: Flat affect Overall Cognitive Status: No family/caregiver present to determine baseline cognitive functioning Area of Impairment: Following commands;Problem solving       Following Commands: Follows one step commands inconsistently (Occasionally follows command with tactile cues)     Problem Solving: Decreased initiation;Requires tactile cues General Comments: Expect pt likely close to baseline for cognition    General Comments      Exercises        Assessment/Plan    PT Assessment Patient needs continued PT services  PT Diagnosis Difficulty walking   PT Problem List Decreased balance;Decreased mobility;Decreased cognition  PT Treatment Interventions DME instruction;Gait training;Functional mobility training;Therapeutic activities;Balance training;Patient/family  education   PT Goals (Current goals can be found in the Care Plan section) Acute Rehab PT Goals Patient Stated Goal: Pt unable to state PT Goal Formulation: Patient unable to participate in goal setting Time For Goal Achievement: 07/25/15 Potential to Achieve Goals: Good    Frequency Min 3X/week   Barriers to discharge        Co-evaluation               End of Session Equipment Utilized During Treatment: Gait belt Activity Tolerance: Other  (comment) (Leaking flexiseal) Patient left: in bed;with call bell/phone within reach;with bed alarm set Nurse Communication: Mobility status (flexiseal leaking)         Time: 1533-1610 PT Time Calculation (min) (ACUTE ONLY): 37 min   Charges:   PT Evaluation $PT Eval Moderate Complexity: 1 Procedure PT Treatments $Therapeutic Activity: 8-22 mins   PT G Codes:        Rhonda Graham 07/18/2015, 5:15 PM [facility] PT [phone]

## 2015-07-18 NOTE — Progress Notes (Signed)
RN entered room to sounding bed alarm.  Bed alarm was set on middle setting.  When RN entered room patient had scooted self down to end of bed and was almost to the point of sitting on side of bed.  Two RN's helped patient stand and assisted patient in getting on the standing scale to obtain morning weight.  When standing patient did seem hesitate about her physical capabilities and physical surroundings.  Took multiple attempts of encouragement for patient to stand, step on the scale, step off the scale and sit back on bed.  RN then asked patient if she could scoot bottom higher in bed towards the head of bed.  RN asked multiple times and patient responses did not make sense as to the request RN was making.  For instance RN asked patient if she could put both hands on the bed to help push herself up towards the head of the bed and patient continued to hold onto her gown with her right hand. RN asked patient if she could let go of the gown and patient stated yes but continues to hold onto gown.  RN helped patient let go of the gown and placed patient's hand on the bed and then asked patient if she could scoot up in bed, patient stated yes but then made no attempt to move.  RN explained to patient they (two RN's present at bedside) were going to help her lay down in bed.  One RN took patient's legs and assisted patient's legs back in bed and the other RN guided patient's upper body.  Both RN's made this a coordinated effort so patient was assisted to lay gently back in bed and then both RN's used bed pads to move patient up to head of bed.  Bed alarm turned on and call light within patient's reach.

## 2015-07-18 NOTE — Plan of Care (Signed)
Problem: Education: Goal: Knowledge of [place] [facility] Education information/materials will improve Outcome: Progressing RN provided medication education to patient pertaining to all medications administered thus far this shift.  RN reviewed plan of care with patient.  Each time RN in room to complete a task RN explains to patient the task prior to starting task and while RN is performing task continues to provide education throughout such as turning patient.  Patient only alert to self thus far throughout shift and does not retain the information RN gives patient.  Patient has been cooperative thus far this shift and does follow commands.  RN has asked patient if she is in pain and patient replied no.  RN also asked patient if she was hurting anywhere and patient replied no.  No signs or symptoms of pain noted per RN thus far this shift.

## 2015-07-18 NOTE — Progress Notes (Signed)
[facility] Progress Note  Patient: Rhonda Graham XBM:[id number]   PCP: [name], [name], MD DOB: 10/16/1934   DOA: 07/14/2015   DOS: 07/18/2015   Date of Service: the patient was seen and examined on 07/18/2015  Subjective: Family was concerned about patient having some increased wheezing this morning. No evidence of wheezing on my evaluation patient is unable to provide further review of system. No acute events overnight identified. Nutrition: Tolerating full liquid   Brief hospital course: Patient was admitted on 07/14/2015, with complaint of abdominal pain and diarrhea. Patient was worked up in the ER extensively. She developed A. fib with RVR with hypotension and cardiology was consulted in the ER. She also underwent an attempted cardioversion in the ER. Critical care was consulted and felt that the patient can be safely admitted to step down unit and hospitalist was consulted for the admission. Currently the diarrhea is improving as well as abdominal pain. Heart rate is also stabilizing Currently further plan is continue antibiotics for the treatment of diverticulitis.  Assessment and Plan: 1. Diverticulitis Patient presented with abdominal pain and diarrhea. CT scan of the abdomen shows evidence of sigmoid diverticulitis without any perforation or abscess. Ultrasound of the abdomen is negative for any evidence of gallbladder infection. Leukocytosis has resolved. Renal function is also stable. With this continue Cipro and Flagyl. Advance to full liquid diet. Monitor clinically. Add Probiotics.  2. A. fib with RVR. CHA2DS2-VASc Score 6. Not on any anticoagulation due to dementia and high fall risk. Continue Lopressor for rate control.  Patient was initially started on heparin which have been discontinued. Patient was initially treated with adenosine without any improvement. Patient was also treated with cardioversion without any improvement. Later on she was given amiodarone bolus  and was started on amiodarone drip. Amiodarone drip was discontinued and current recommendation from EP is rate control. Continue Lopressor for rate control at present.  3. Hypothyroidism. Iatrogenic hyperthyroidism. Patient is on levothyroxine 125 g. A T4 elevated free tsh suppressed. I would reduce the dose of levothyroxine 100 g. Recheck in one month.  4. CJD Dementia with delirium. Currently stable no evidence of acute abnormality. Resume Seroquel. MRI of the brain rule out any acute intracranial abnormality.  5. Hypokalemia. Stable  Magnesium level stable.  Activity: physical therapy recommends SNF versus ALF with memory care. Bowel regimen: last BM 07/18/2015 DVT Prophylaxis: subcutaneous Heparin Nutrition: full liquid diet Advance goals of care discussion: As with patient's daughter at length this morning. She would like the patient to be DNR/DNI. She would also like a discussion with hospice to see whether the patient will be a candidate for the same as well. At present I feel that the patient will be an ideal candidate for hospice with ALF at memory care. Otherwise patient may benefit from SNF with palliative care support.  Procedures: none Consultants: Cardiology, electrophysiology, critical care. Antibiotics: Anti-infectives    Start     Dose/Rate Route Frequency Ordered Stop   07/15/15 1600  piperacillin-tazobactam (ZOSYN) IVPB 3.375 g     3.375 g 12.5 mL/hr over 240 Minutes Intravenous 3 times per day 07/15/15 1345     07/15/15 1600  metroNIDAZOLE (FLAGYL) IVPB 500 mg  Status:  Discontinued     500 mg 100 mL/hr over 60 Minutes Intravenous Every 8 hours 07/15/15 1345 07/16/15 0921   07/15/15 0800  metroNIDAZOLE (FLAGYL) IVPB 500 mg     500 mg 100 mL/hr over 60 Minutes Intravenous  Once 07/15/15 0753 07/15/15 1120  07/15/15 0500  piperacillin-tazobactam (ZOSYN) IVPB 3.375 g     3.375 g 100 mL/hr over 30 Minutes Intravenous  Once 07/15/15 0456 07/15/15 9604       Family Communication: family was present at bedside, at the time of interview.  Opportunity was given to ask question and all questions were answered satisfactorily.   Disposition:  Expected discharge date: 07/20/2015 Barriers to safe discharge: Improvement in diverticulitis   Intake/Output Summary (Last 24 hours) at 07/18/15 1812 Last data filed at 07/18/15 1400  Gross per 24 hour  Intake 1510.83 ml  Output   1225 ml  Net 285.83 ml   Filed Weights   07/16/15 0348 07/17/15 0529 07/18/15 0355  Weight: 73.7 kg (162 lb 7.7 oz) 75 kg (165 lb 5.5 oz) 70.398 kg (155 lb 3.2 oz)    Objective: Physical Exam: Filed Vitals:   07/18/15 0400 07/18/15 0405 07/18/15 0410 07/18/15 1434  BP:   148/71 139/83  Pulse:  121 113 110  Temp:  97.6 F (36.4 C)  98.2 F (36.8 C)  TempSrc:  Oral  Oral  Resp: 22   18  Height:      Weight:      SpO2:  97%  98%    General: Appear in moderate distress, no Rash; Oral Mucosa moist. Cardiovascular: S1 and S2 Present, no Murmur,  Respiratory: Bilateral Air entry present and Clear to Auscultation, no Crackles, no wheezes Abdomen: Bowel Sound present, Soft and no tenderness Extremities: no Pedal edema, no calf tenderness Neurology: Grossly no focal neuro deficit. Difficult exam due to disorientation having difficulty following command  Data Reviewed: CBC:  Recent Labs Lab 07/14/15 2218 07/14/15 2259 07/16/15 0317 07/17/15 0407 07/18/15 0526  WBC 11.8*  --  10.2 10.7* 10.2  NEUTROABS 10.1*  --   --  7.7  --   HGB 11.9* 12.9 11.7* 12.0 11.9*  HCT 35.6* 38.0 36.9 36.6 37.1  MCV 87.5  --  87.2 88.4 87.9  PLT 215  --  240 232 222   Basic Metabolic Panel:  Recent Labs Lab 07/14/15 2218 07/14/15 2259 07/15/15 0132 07/15/15 0849 07/16/15 0317 07/16/15 0359 07/17/15 0407 07/18/15 0526  NA 136 138  --   --  142  --  141 142  K 4.1 4.1  --   --  3.2*  --  4.1 4.0  CL 104 103  --   --  110  --  110 112*  CO2 21*  --   --   --  22  --   22 20*  GLUCOSE 160* 147*  --   --  118*  --  115* 112*  BUN 38* 38*  --   --  14  --  10 9  CREATININE 1.44* 1.40*  --   --  0.98  --  0.86 1.03*  CALCIUM 9.2  --   --   --  8.5*  --  8.8* 8.6*  MG  --   --  1.7  --   --  1.9  --  1.5*  PHOS  --   --   --  2.4*  --   --   --   --    Liver Function Tests:  Recent Labs Lab 07/14/15 2218 07/17/15 0407  AST 30 24  ALT 16 20  ALKPHOS 54 50  BILITOT 0.6 1.1  PROT 6.1* 5.7*  ALBUMIN 3.6 3.0*    Recent Labs Lab 07/14/15 2218  LIPASE 31   No  results for input(s): AMMONIA in the last 168 hours.  Cardiac Enzymes:  Recent Labs Lab 07/15/15 0849  TROPONINI <0.03    BNP (last 3 results) No results for input(s): BNP in the last 8760 hours.  CBG: No results for input(s): GLUCAP in the last 168 hours.  Recent Results (from the past 240 hour(s))  MRSA PCR Screening     Status: None   Collection Time: 07/15/15  2:11 PM  Result Value Ref Range Status   MRSA by PCR NEGATIVE NEGATIVE Final    Comment:        The GeneXpert MRSA Assay (FDA approved for NASAL specimens only), is one component of a comprehensive MRSA colonization surveillance program. It is not intended to diagnose MRSA infection nor to guide or monitor treatment for MRSA infections.   C difficile quick scan w PCR reflex     Status: None   Collection Time: 07/17/15  1:11 PM  Result Value Ref Range Status   C Diff antigen NEGATIVE NEGATIVE Final   C Diff toxin NEGATIVE NEGATIVE Final   C Diff interpretation Negative for toxigenic C. difficile  Final     Studies: No results found.   Scheduled Meds: . levothyroxine  100 mcg Oral QAC breakfast  . metoprolol tartrate  50 mg Oral BID  . piperacillin-tazobactam (ZOSYN)  IV  3.375 g Intravenous 3 times per day  . saccharomyces boulardii  250 mg Oral BID  . simvastatin  20 mg Oral q1800   Continuous Infusions:   PRN Meds: acetaminophen, ondansetron (ZOFRAN) IV, zolpidem  Time spent: 30  minutes  Author: Lynden Oxford, MD [facility] Pager: [phone] 07/18/2015 6:12 PM  If 7PM-7AM, please contact night-coverage at [email or web address], password [email or web address]

## 2015-07-19 LAB — CBC WITH DIFFERENTIAL/PLATELET
BASOS ABS: 0.1 10*3/uL (ref 0.0–0.1)
BASOS PCT: 0 %
EOS ABS: 0.5 10*3/uL (ref 0.0–0.7)
Eosinophils Relative: 4 %
HEMATOCRIT: 36.5 % (ref 36.0–46.0)
HEMOGLOBIN: 11.6 g/dL — AB (ref 12.0–15.0)
Lymphocytes Relative: 14 %
Lymphs Abs: 1.8 10*3/uL (ref 0.7–4.0)
MCH: 28 pg (ref 26.0–34.0)
MCHC: 31.8 g/dL (ref 30.0–36.0)
MCV: 88.2 fL (ref 78.0–100.0)
Monocytes Absolute: 1 10*3/uL (ref 0.1–1.0)
Monocytes Relative: 8 %
NEUTROS ABS: 9.4 10*3/uL — AB (ref 1.7–7.7)
NEUTROS PCT: 74 %
Platelets: 227 10*3/uL (ref 150–400)
RBC: 4.14 MIL/uL (ref 3.87–5.11)
RDW: 13.4 % (ref 11.5–15.5)
WBC: 12.7 10*3/uL — AB (ref 4.0–10.5)

## 2015-07-19 LAB — COMPREHENSIVE METABOLIC PANEL
ALBUMIN: 2.8 g/dL — AB (ref 3.5–5.0)
ALK PHOS: 43 U/L (ref 38–126)
ALT: 23 U/L (ref 14–54)
ANION GAP: 10 (ref 5–15)
AST: 36 U/L (ref 15–41)
BILIRUBIN TOTAL: 0.6 mg/dL (ref 0.3–1.2)
BUN: 9 mg/dL (ref 6–20)
CALCIUM: 8.5 mg/dL — AB (ref 8.9–10.3)
CO2: 23 mmol/L (ref 22–32)
Chloride: 110 mmol/L (ref 101–111)
Creatinine, Ser: 0.95 mg/dL (ref 0.44–1.00)
GFR calc non Af Amer: 56 mL/min — ABNORMAL LOW (ref >60)
Glucose, Bld: 99 mg/dL (ref 65–99)
Potassium: 3.8 mmol/L (ref 3.5–5.1)
SODIUM: 143 mmol/L (ref 135–145)
TOTAL PROTEIN: 5.2 g/dL — AB (ref 6.5–8.1)

## 2015-07-19 LAB — MAGNESIUM: Magnesium: 1.6 mg/dL — ABNORMAL LOW (ref 1.7–2.4)

## 2015-07-19 MED ORDER — AMOXICILLIN-POT CLAVULANATE 500-125 MG PO TABS
1.0000 | ORAL_TABLET | Freq: Three times a day (TID) | ORAL | Status: DC
  Administered 2015-07-19 – 2015-07-20 (×4): 500 mg via ORAL
  Filled 2015-07-19 (×5): qty 1

## 2015-07-19 MED ORDER — MAGNESIUM SULFATE 2 GM/50ML IV SOLN
2.0000 g | Freq: Once | INTRAVENOUS | Status: AC
  Administered 2015-07-19: 2 g via INTRAVENOUS
  Filled 2015-07-19: qty 50

## 2015-07-19 NOTE — Progress Notes (Signed)
Physical Therapy Treatment Patient Details Name: Rhonda JourneyRuth C Cureton MRN: [id number] DOB: 01/06/1935 Today's Date: 07/19/2015    History of Present Illness Pt adm with diverticulitis and afib. PMH-  Creutzfeldt-Jakob disease, HTN, CVA    PT Comments    Pt performed increased gait after flexiseal removed.  Cognition remains to limit progress but able to progress mobility this session.    Follow Up Recommendations  SNF (vs memory care at ALF)     Equipment Recommendations  Other (comment) (TBA)    Recommendations for Other Services       Precautions / Restrictions Precautions Precautions: Fall Restrictions Weight Bearing Restrictions: No    Mobility  Bed Mobility Overal bed mobility: Needs Assistance Bed Mobility: Supine to Sit     Supine to sit: Min guard     General bed mobility comments: Required increased time and tactile cueing to advance to edge of bed.    Transfers Overall transfer level: Needs assistance Equipment used: Rolling walker (2 wheeled) Transfers: Sit to/from Stand Sit to Stand: Min assist         General transfer comment: Required increased time and tactile cues to initiate sitting and standing.    Ambulation/Gait Ambulation/Gait assistance: Min assist Ambulation Distance (Feet): 50 Feet Assistive device: Rolling walker (2 wheeled) Gait Pattern/deviations: Step-through pattern;Narrow base of support;Decreased stride length;Staggering left;Staggering right     General Gait Details: Pt required cues to advance gait forward to improve OOB activity.     Stairs            Wheelchair Mobility    Modified Rankin (Stroke Patients Only)       Balance Overall balance assessment: Needs assistance   Sitting balance-Leahy Scale: Good       Standing balance-Leahy Scale: Fair                      Cognition Arousal/Alertness: Awake/alert Behavior During Therapy: Flat affect Overall Cognitive Status: No family/caregiver present  to determine baseline cognitive functioning Area of Impairment: Following commands;Problem solving       Following Commands: Follows one step commands inconsistently (intermittent following of commands with tactile feedback.  )     Problem Solving: Decreased initiation;Requires tactile cues General Comments: Expect pt likely close to baseline for cognition    Exercises      General Comments        Pertinent Vitals/Pain Pain Assessment: No/denies pain    Home Living                      Prior Function            PT Goals (current goals can now be found in the care plan section) Acute Rehab PT Goals Patient Stated Goal: Pt unable to state PT Goal Formulation: Patient unable to participate in goal setting Potential to Achieve Goals: Good Progress towards PT goals: Progressing toward goals    Frequency  Min 3X/week    PT Plan      Co-evaluation             End of Session Equipment Utilized During Treatment: Gait belt Activity Tolerance: Patient tolerated treatment well Patient left: in bed;with call bell/phone within reach;with bed alarm set     Time: [id number] PT Time Calculation (min) (ACUTE ONLY): 26 min  Charges:  $Gait Training: 8-22 mins $Therapeutic Activity: 8-22 mins  G Codes:      Florestine Avers 07/19/2015, 4:50 PM Joycelyn Rua, PTA pager [phone]

## 2015-07-19 NOTE — Plan of Care (Signed)
Problem: Education: Goal: Knowledge of [facility] Education information/materials will improve Outcome: Not Progressing Unable to educate pt due to confusion.

## 2015-07-19 NOTE — [place] FL2 (Signed)
[place] MEDICAID FL2 LEVEL OF CARE SCREENING TOOL     IDENTIFICATION  Patient Name: Rhonda JourneyRuth C Picardi Birthdate: 09/01/1934 Sex: female Admission Date (Current Location): 07/14/2015  [place] [facility] [place]   Facility and Address:  [facility], [address], [place], [place] [address]      Provider Number: [id number]  Attending Physician Name and Address:  Rolly SalterPranav M Patel, MD  Relative Name and Phone Number:  Dois DavenportSandra, daughter, [phone]    Current Level of Care: Hospital Recommended Level of Care: Skilled Nursing Facility Prior Approval Number:    Date Approved/Denied:   PASRR Number: [id number]  Discharge Plan: SNF    Current Diagnoses: Patient Active Problem List   Diagnosis Date Noted  . CKD (chronic kidney disease) stage 3, GFR 30-59 ml/min 07/16/2015  . Iatrogenic hyperthyroidism 07/16/2015  . CJD (Creutzfeldt-Jakob disease) 07/16/2015  . Acute encephalopathy   . Diarrhea   . Atrial fibrillation with rapid ventricular response (HCC) 07/15/2015  . Diverticulitis 07/15/2015  . Hypothyroid 07/15/2015  . HLD (hyperlipidemia) 07/15/2015  . Postprocedural hypotension   . Elevated troponin   . A-fib (HCC) 08/21/2014  . Paroxysmal atrial fibrillation (HCC) 07/29/2014  . Morbid obesity (HCC) 07/29/2014  . Essential hypertension 07/29/2014    Orientation RESPIRATION BLADDER Height & Weight     Self  O2 (1L/min) Continent, Indwelling catheter (Urinary catheter) Weight: 167 lb 12.3 oz (76.1 kg) Height:  5\' 6"  (167.6 cm)  BEHAVIORAL SYMPTOMS/MOOD NEUROLOGICAL BOWEL NUTRITION STATUS      Incontinent  (Please see DC summary)  AMBULATORY STATUS COMMUNICATION OF NEEDS Skin   Extensive Assist Verbally Normal                       Personal Care Assistance Level of Assistance  Bathing, Feeding, Dressing Bathing Assistance: Maximum assistance Feeding assistance: Limited assistance Dressing Assistance: Maximum assistance     Functional Limitations Info             SPECIAL CARE FACTORS FREQUENCY  PT (By licensed PT)     PT Frequency: min 3x/week              Contractures      Additional Factors Info  Code Status, Allergies Code Status Info: DNR Allergies Info: NKA           Current Medications (07/19/2015):  This is the current hospital active medication list Current Facility-Administered Medications  Medication Dose Route Frequency Provider Last Rate Last Dose  . acetaminophen (TYLENOL) tablet 650 mg  650 mg Oral Q4H PRN Haydee SalterPhillip M Hobbs, MD      . amoxicillin-clavulanate (AUGMENTIN) 500-125 MG per tablet 500 mg  1 tablet Oral 3 times per day Rolly SalterPranav M Patel, MD      . levothyroxine (SYNTHROID, LEVOTHROID) tablet 100 mcg  100 mcg Oral QAC breakfast Rolly SalterPranav M Patel, MD   100 mcg at 07/19/15 0730  . magnesium sulfate IVPB 2 g 50 mL  2 g Intravenous Once Rolly SalterPranav M Patel, MD      . metoprolol (LOPRESSOR) tablet 50 mg  50 mg Oral BID Rolly SalterPranav M Patel, MD   50 mg at 07/19/15 0816  . ondansetron (ZOFRAN) injection 4 mg  4 mg Intravenous Q6H PRN Haydee SalterPhillip M Hobbs, MD      . saccharomyces boulardii [facility](FLORASTOR) capsule 250 mg  250 mg Oral BID Rolly SalterPranav M Patel, MD   250 mg at 07/19/15 0816  . simvastatin (ZOCOR) tablet 20  mg  20 mg Oral q1800 Haydee Salter, MD   20 mg at 07/18/15 1800  . zolpidem (AMBIEN) tablet 5 mg  5 mg Oral QHS PRN Haydee Salter, MD   5 mg at 07/18/15 2231     Discharge Medications: Please see discharge summary for a list of discharge medications.  Relevant Imaging Results:  Relevant Lab Results:   Additional Information SSN: [id number] [place], [place]

## 2015-07-19 NOTE — Progress Notes (Signed)
[facility] Progress Note  Patient: Rhonda Graham ONG:[id number]   PCP: [name], [name], MD DOB: 12/19/1934   DOA: 07/14/2015   DOS: 07/19/2015   Date of Service: the patient was seen and examined on 07/19/2015  Subjective: No other acute events identified overnight. Tolerating oral diet. Diarrhea improving. Nutrition: Tolerating soft diet   Brief hospital course: Patient was admitted on 07/14/2015, with complaint of abdominal pain and diarrhea. Patient was worked up in the ER extensively. She developed A. fib with RVR with hypotension and cardiology was consulted in the ER. She also underwent an attempted cardioversion in the ER. Critical care was consulted and felt that the patient can be safely admitted to step down unit and hospitalist was consulted for the admission. Currently the diarrhea is improving as well as abdominal pain. Heart rate is also stabilizing Currently further plan is continue antibiotics for the treatment of diverticulitis.  Assessment and Plan: 1. Diverticulitis Patient presented with abdominal pain and diarrhea. CT scan of the abdomen shows evidence of sigmoid diverticulitis without any perforation or abscess. Ultrasound of the abdomen is negative for any evidence of gallbladder infection. Leukocytosis has resolved. Renal function is also stable. Continue Probiotics. Transition to Augmentin. Tolerating soft diet. Will likely discharge tomorrow  2. A. fib with RVR. CHA2DS2-VASc Score 6. Not on any anticoagulation due to dementia and high fall risk. Continue Lopressor for rate control.  Patient was initially started on heparin which have been discontinued. Patient was initially treated with adenosine without any improvement. Patient was also treated with cardioversion without any improvement. Later on she was given amiodarone bolus and was started on amiodarone drip. Amiodarone drip was discontinued and current recommendation from EP is rate control. Continue  Lopressor for rate control at present.  3. Hypothyroidism. Iatrogenic hyperthyroidism. Patient is on levothyroxine 125 g. A T4 elevated free tsh suppressed. I would reduce the dose of levothyroxine 100 g. Recheck in one month.  4. CJD Dementia with delirium. Currently stable no evidence of acute abnormality. Resume Seroquel. MRI of the brain rule out any acute intracranial abnormality.  5. Hypokalemia. Stable  Magnesium level stable.  Activity: physical therapy recommends SNF versus ALF with memory care. Bowel regimen: last BM 07/19/2015 DVT Prophylaxis: subcutaneous Heparin Nutrition: full liquid diet Advance goals of care discussion: As with patient's daughter at length this morning. She would like the patient to be DNR/DNI. She would also like a discussion with hospice to see whether the patient will be a candidate for the same as well. At present I feel that the patient will be an ideal candidate for hospice with ALF at memory care. Otherwise patient may benefit from SNF with palliative care support.  Procedures: none Consultants: Cardiology, electrophysiology, critical care. Antibiotics: Anti-infectives    Start     Dose/Rate Route Frequency Ordered Stop   07/19/15 1400  amoxicillin-clavulanate (AUGMENTIN) 500-125 MG per tablet 500 mg     1 tablet Oral 3 times per day 07/19/15 1014     07/15/15 1600  piperacillin-tazobactam (ZOSYN) IVPB 3.375 g  Status:  Discontinued     3.375 g 12.5 mL/hr over 240 Minutes Intravenous 3 times per day 07/15/15 1345 07/19/15 1014   07/15/15 1600  metroNIDAZOLE (FLAGYL) IVPB 500 mg  Status:  Discontinued     500 mg 100 mL/hr over 60 Minutes Intravenous Every 8 hours 07/15/15 1345 07/16/15 0921   07/15/15 0800  metroNIDAZOLE (FLAGYL) IVPB 500 mg     500 mg 100 mL/hr over 60  Minutes Intravenous  Once 07/15/15 0753 07/15/15 1120   07/15/15 0500  piperacillin-tazobactam (ZOSYN) IVPB 3.375 g     3.375 g 100 mL/hr over 30 Minutes  Intravenous  Once 07/15/15 0456 07/15/15 1610      Family Communication: no family was present at bedside, at the time of interview.   Disposition:  Expected discharge date: 07/20/2015 Barriers to safe discharge: Improvement in diverticulitis   Intake/Output Summary (Last 24 hours) at 07/19/15 1858 Last data filed at 07/19/15 1013  Gross per 24 hour  Intake      0 ml  Output    725 ml  Net   -725 ml   Filed Weights   07/17/15 0529 07/18/15 0355 07/19/15 0600  Weight: 75 kg (165 lb 5.5 oz) 70.398 kg (155 lb 3.2 oz) 76.1 kg (167 lb 12.3 oz)    Objective: Physical Exam: Filed Vitals:   07/18/15 2156 07/18/15 2231 07/19/15 0600 07/19/15 0816  BP: 139/69   142/70  Pulse:  129  88  Temp: 98 F (36.7 C)   98.6 F (37 C)  TempSrc: Oral   Axillary  Resp:      Height:      Weight:   76.1 kg (167 lb 12.3 oz)   SpO2: 98%   96%    General: Appear in moderate distress, no Rash; Oral Mucosa moist. Cardiovascular: S1 and S2 Present, no Murmur,  Respiratory: Bilateral Air entry present and Clear to Auscultation, no Crackles, no wheezes Abdomen: Bowel Sound present, Soft and no tenderness Extremities: no Pedal edema, no calf tenderness Neurology: Grossly no focal neuro deficit. Difficult exam due to disorientation having difficulty following command  Data Reviewed: CBC:  Recent Labs Lab 07/14/15 2218 07/14/15 2259 07/16/15 0317 07/17/15 0407 07/18/15 0526 07/19/15 0321  WBC 11.8*  --  10.2 10.7* 10.2 12.7*  NEUTROABS 10.1*  --   --  7.7  --  9.4*  HGB 11.9* 12.9 11.7* 12.0 11.9* 11.6*  HCT 35.6* 38.0 36.9 36.6 37.1 36.5  MCV 87.5  --  87.2 88.4 87.9 88.2  PLT 215  --  240 232 222 227   Basic Metabolic Panel:  Recent Labs Lab 07/14/15 2218 07/14/15 2259 07/15/15 0132 07/15/15 0849 07/16/15 0317 07/16/15 0359 07/17/15 0407 07/18/15 0526 07/19/15 0321  NA 136 138  --   --  142  --  141 142 143  K 4.1 4.1  --   --  3.2*  --  4.1 4.0 3.8  CL 104 103  --   --  110   --  110 112* 110  CO2 21*  --   --   --  22  --  22 20* 23  GLUCOSE 160* 147*  --   --  118*  --  115* 112* 99  BUN 38* 38*  --   --  14  --  CREATININE 1.44* 1.40*  --   --  0.98  --  0.86 1.03* 0.95  CALCIUM 9.2  --   --   --  8.5*  --  8.8* 8.6* 8.5*  MG  --   --  1.7  --   --  1.9  --  1.5* 1.6*  PHOS  --   --   --  2.4*  --   --   --   --   --    Liver Function Tests:  Recent Labs Lab 07/14/15 2218 07/17/15 0407 07/19/15 0321  AST 30  24 36  ALT ALKPHOS 54 50 43  BILITOT 0.6 1.1 0.6  PROT 6.1* 5.7* 5.2*  ALBUMIN 3.6 3.0* 2.8*    Recent Labs Lab 07/14/15 2218  LIPASE 31   No results for input(s): AMMONIA in the last 168 hours.  Cardiac Enzymes:  Recent Labs Lab 07/15/15 0849  TROPONINI <0.03    BNP (last 3 results) No results for input(s): BNP in the last 8760 hours.  CBG: No results for input(s): GLUCAP in the last 168 hours.  Recent Results (from the past 240 hour(s))  MRSA PCR Screening     Status: None   Collection Time: 07/15/15  2:11 PM  Result Value Ref Range Status   MRSA by PCR NEGATIVE NEGATIVE Final    Comment:        The GeneXpert MRSA Assay (FDA approved for NASAL specimens only), is one component of a comprehensive MRSA colonization surveillance program. It is not intended to diagnose MRSA infection nor to guide or monitor treatment for MRSA infections.   Gastrointestinal Panel by PCR , Stool     Status: None   Collection Time: 07/16/15 10:04 PM  Result Value Ref Range Status   Campylobacter species NOT DETECTED NOT DETECTED Final   Plesimonas shigelloides NOT DETECTED NOT DETECTED Final   Salmonella species NOT DETECTED NOT DETECTED Final   Yersinia enterocolitica NOT DETECTED NOT DETECTED Final   Vibrio species NOT DETECTED NOT DETECTED Final   Vibrio cholerae NOT DETECTED NOT DETECTED Final   Enteroaggregative E coli (EAEC) NOT DETECTED NOT DETECTED Final   Enteropathogenic E coli (EPEC) NOT DETECTED NOT  DETECTED Final   Enterotoxigenic E coli (ETEC) NOT DETECTED NOT DETECTED Final   Shiga like toxin producing E coli (STEC) NOT DETECTED NOT DETECTED Final   E. coli O157 NOT DETECTED NOT DETECTED Final   Shigella/Enteroinvasive E coli (EIEC) NOT DETECTED NOT DETECTED Final   Cryptosporidium NOT DETECTED NOT DETECTED Final   Cyclospora cayetanensis NOT DETECTED NOT DETECTED Final   Entamoeba histolytica NOT DETECTED NOT DETECTED Final   Giardia lamblia NOT DETECTED NOT DETECTED Final   Adenovirus F40/41 NOT DETECTED NOT DETECTED Final   Astrovirus NOT DETECTED NOT DETECTED Final   Norovirus GI/GII NOT DETECTED NOT DETECTED Final   Rotavirus A NOT DETECTED NOT DETECTED Final   Sapovirus (I, II, IV, and V) NOT DETECTED NOT DETECTED Final  C difficile quick scan w PCR reflex     Status: None   Collection Time: 07/17/15  1:11 PM  Result Value Ref Range Status   C Diff antigen NEGATIVE NEGATIVE Final   C Diff toxin NEGATIVE NEGATIVE Final   C Diff interpretation Negative for toxigenic C. difficile  Final     Studies: No results found.   Scheduled Meds: . amoxicillin-clavulanate  1 tablet Oral 3 times per day  . levothyroxine  100 mcg Oral QAC breakfast  . metoprolol tartrate  50 mg Oral BID  . saccharomyces boulardii  250 mg Oral BID  . simvastatin  20 mg Oral q1800   Continuous Infusions:   PRN Meds: acetaminophen, ondansetron (ZOFRAN) IV, zolpidem  Time spent: 30 minutes  Author: Lynden Oxford, MD [facility] Pager: [phone] 07/19/2015 6:58 PM  If 7PM-7AM, please contact night-coverage at [email or web address], password [facility]

## 2015-07-19 NOTE — Progress Notes (Signed)
CSW still searching for SNF placement. Facilities are unable to accept patient due to patient requiring a bed alarm.   Rhonda Graham LCSWA [phone]

## 2015-07-19 NOTE — Clinical Social Work Placement (Signed)
   CLINICAL SOCIAL WORK PLACEMENT  NOTE  Date:  07/19/2015  Patient Details  Name: Rhonda Graham MRN: [id number] Date of Birth: 06/22/1934  Clinical Social Work is seeking post-discharge placement for this patient at the Skilled  Nursing Facility level of care (*CSW will initial, date and re-position this form in  chart as items are completed):  Yes   Patient/family provided with [place] Clinical Social Work Department's list of facilities offering this level of care within the geographic area requested by the patient (or if unable, by the patient's family).  Yes   Patient/family informed of their freedom to choose among providers that offer the needed level of care, that participate in Medicare, Medicaid or managed care program needed by the patient, have an available bed and are willing to accept the patient.  Yes   Patient/family informed of [place]'s ownership interest in [facility] and [facility], as well as of the fact that they are under no obligation to receive care at these facilities.  PASRR submitted to EDS on       PASRR number received on       Existing PASRR number confirmed on 07/19/15     FL2 transmitted to all facilities in geographic area requested by pt/family on 07/19/15     FL2 transmitted to all facilities within larger geographic area on       Patient informed that his/her managed care company has contracts with or will negotiate with certain facilities, including the following:            Patient/family informed of bed offers received.  Patient chooses bed at       Physician recommends and patient chooses bed at      Patient to be transferred to   on  .  Patient to be transferred to facility by       Patient family notified on   of transfer.  Name of family member notified:        PHYSICIAN       Additional Comment:    _______________________________________________ Mearl LatinNadia S Tallie Hevia, LCSWA 07/19/2015, 3:40 PM

## 2015-07-19 NOTE — Care Management Important Message (Signed)
Important Message  Patient Details  Name: Rhonda Graham MRN: [id number] Date of Birth: 10/17/1934   Medicare Important Message Given:  Yes    Estrella Alcaraz P Rosalee Tolley 07/19/2015, 1:54 PM

## 2015-07-19 NOTE — Clinical Social Work Note (Signed)
Clinical Social Work Assessment  Patient Details  Name: Rhonda Graham MRN: [id number] Date of Birth: 12/02/1934  Date of referral:  07/19/15               Reason for consult:  Facility Placement                Permission sought to share information with:  Facility Medical sales representativeContact Representative, Family Supports Permission granted to share information::  No (Patient is disoriented; completed assessment w/ daughter)  Name::     Dois DavenportSandra  Agency::  [facility]  Relationship::  Daughter  Contact Information:  [phone]  Housing/Transportation Living arrangements for the past 2 months:  Single Family Home Source of Information:  Adult Children Patient Interpreter Needed:  None Criminal Activity/Legal Involvement Pertinent to Current Situation/Hospitalization:  No - Comment as needed Significant Relationships:  Adult Children Lives with:  Adult Children Do you feel safe going back to the place where you live?  No Need for family participation in patient care:  Yes (Comment)  Care giving concerns:  CSW received referral for possible SNF placement at time of discharge. Patient is disoriented. CSW spoke with patient's daughter, Dois DavenportSandra, regarding PT recommendation of SNF placement at time of discharge. Per patient's daughter, patient lives with her, but due to current fall risk, patient would require SNF. Patient's daughter expressed understanding of PT recommendation and is agreeable to SNF placement at time of discharge. CSW to continue to follow and assist with discharge planning needs.  Social Worker assessment / plan:  CSW spoke with patient's daughter concerning possibility [facility] before returning home.  Employment status:  Retired Database administratornsurance information:  Managed Medicare PT Recommendations:  Skilled Nursing Facility Information / Referral to community resources:  Skilled Nursing Facility  Patient/Family's Response to care:  Patient's daughter recognizes need for rehab before  returning patient home and is agreeable to a SNF [place]. Patient reported [facility], since her family member has been there before.  Patient/Family's Understanding of and Emotional Response to Diagnosis, Current Treatment, and Prognosis:  Patient's daughter is realistic regarding therapy needs. No questions/concerns about plan or treatment.    Emotional Assessment Appearance:  Appears stated age Attitude/Demeanor/Rapport:  Unable to Assess Affect (typically observed):  Unable to Assess Orientation:  Oriented to Self Alcohol / Substance use:  Not Applicable Psych involvement (Current and /or in the community):  No (Comment)  Discharge Needs  Concerns to be addressed:  Care Coordination Readmission within the last 30 days:  No Current discharge risk:  None Barriers to Discharge:  Continued Medical Work up   [employer] S Kamon Fahr, LCSWA 07/19/2015, 3:16 PM

## 2015-07-20 ENCOUNTER — Inpatient Hospital Stay (HOSPITAL_COMMUNITY): Payer: Medicare HMO

## 2015-07-20 DIAGNOSIS — I480 Paroxysmal atrial fibrillation: Secondary | ICD-10-CM | POA: Diagnosis not present

## 2015-07-20 LAB — BASIC METABOLIC PANEL
Anion gap: 10 (ref 5–15)
BUN: <5 mg/dL — ABNORMAL LOW (ref 6–20)
CALCIUM: 8.8 mg/dL — AB (ref 8.9–10.3)
CO2: 25 mmol/L (ref 22–32)
CREATININE: 0.86 mg/dL (ref 0.44–1.00)
Chloride: 106 mmol/L (ref 101–111)
Creatinine: 0.9 mg/dL (ref 0.5–1.1)
GFR calc non Af Amer: >60 mL/min (ref >60)
Glucose, Bld: 104 mg/dL — ABNORMAL HIGH (ref 65–99)
Glucose: 104 mg/dL
Potassium: 3.6 mmol/L (ref 3.5–5.1)
SODIUM: 141 mmol/L (ref 137–147)
SODIUM: 141 mmol/L (ref 135–145)

## 2015-07-20 LAB — CBC
HEMATOCRIT: 40.6 % (ref 36.0–46.0)
Hemoglobin: 12.8 g/dL (ref 12.0–15.0)
MCH: 27.7 pg (ref 26.0–34.0)
MCHC: 31.5 g/dL (ref 30.0–36.0)
MCV: 87.9 fL (ref 78.0–100.0)
Platelets: 263 10*3/uL (ref 150–400)
RBC: 4.62 MIL/uL (ref 3.87–5.11)
RDW: 13.3 % (ref 11.5–15.5)
WBC: 14.3 10*3/uL — ABNORMAL HIGH (ref 4.0–10.5)

## 2015-07-20 LAB — CBC AND DIFFERENTIAL: WBC: 14.3 10^3/mL

## 2015-07-20 MED ORDER — LEVOTHYROXINE SODIUM 100 MCG PO TABS: 100.0000 ug | ORAL_TABLET | Freq: Every day | ORAL | Status: DC

## 2015-07-20 MED ORDER — BACID PO TABS: 2.0000 | ORAL_TABLET | Freq: Two times a day (BID) | ORAL | Status: AC

## 2015-07-20 MED ORDER — PNEUMOCOCCAL VAC POLYVALENT 25 MCG/0.5ML IJ INJ
0.5000 mL | INJECTION | Freq: Once | INTRAMUSCULAR | Status: AC
  Administered 2015-07-20: 0.5 mL via INTRAMUSCULAR
  Filled 2015-07-20 (×2): qty 0.5

## 2015-07-20 MED ORDER — AMOXICILLIN-POT CLAVULANATE 500-125 MG PO TABS: 1.0000 | ORAL_TABLET | Freq: Three times a day (TID) | ORAL | Status: AC

## 2015-07-20 MED ORDER — METOPROLOL TARTRATE 50 MG PO TABS: 50.0000 mg | ORAL_TABLET | Freq: Two times a day (BID) | ORAL | Status: AC

## 2015-07-20 NOTE — Care Management Note (Signed)
Case Management Note  Patient Details  Name: Radene JourneyRuth C Iacovelli MRN: [id number] Date of Birth: 08/10/1934  Subjective/Objective:   Pt admitted for Abdominal pain- A fib RVR. Plan will be for SNF with Palliative Care.                  Action/Plan:CSW assisting with disposition needs. No further needs from CM at this time.   Expected Discharge Date:                  Expected Discharge Plan:  Skilled Nursing Facility  In-House Referral:  Clinical Social Work  Discharge planning Services  CM Consult  Post Acute Care Choice:  NA Choice offered to:  NA  DME Arranged:  N/A DME Agency:  NA  HH Arranged:  NA HH Agency:  NA  Status of Service:  Completed, signed off  Medicare Important Message Given:  Yes Date Medicare IM Given:    Medicare IM give by:    Date Additional Medicare IM Given:    Additional Medicare Important Message give by:     If discussed at Long Length of Stay Meetings, dates discussed:    Additional Comments:  Gala LewandowskyGraves-Bigelow, Arel Tippen Kaye, RN 07/20/2015, 11:34 AM

## 2015-07-20 NOTE — Progress Notes (Signed)
[facility] Anticipated discharge date: 4/18 Family notified: dtr at bedside Transportation by PTAR- called at 4:25pm  CSW signing off.  Merlyn LotJenna Holoman, LCSWA Clinical Social Worker [phone]

## 2015-07-20 NOTE — Clinical Social Work Placement (Signed)
   CLINICAL SOCIAL WORK PLACEMENT  NOTE  Date:  07/20/2015  Patient Details  Name: Rhonda Graham MRN: [id number] Date of Birth: 05/12/1934  Clinical Social Work is seeking post-discharge placement for this patient at the Skilled  Nursing Facility level of care (*CSW will initial, date and re-position this form in  chart as items are completed):  Yes   Patient/family provided with [place] Clinical Social Work Department's list of facilities offering this level of care within the geographic area requested by the patient (or if unable, by the patient's family).  Yes   Patient/family informed of their freedom to choose among providers that offer the needed level of care, that participate in Medicare, Medicaid or managed care program needed by the patient, have an available bed and are willing to accept the patient.  Yes   Patient/family informed of [place]'s ownership interest in [facility] and [facility], as well as of the fact that they are under no obligation to receive care at these facilities.  PASRR submitted to EDS on       PASRR number received on       Existing PASRR number confirmed on 07/19/15     FL2 transmitted to all facilities in geographic area requested by pt/family on 07/19/15     FL2 transmitted to all facilities within larger geographic area on       Patient informed that his/her managed care company has contracts with or will negotiate with certain facilities, including the following:        Yes   Patient/family informed of bed offers received.  [facility]     Physician recommends and patient chooses bed at      [facility] to [facility] on 07/20/15.  Patient to be transferred to facility by ptar     Patient family notified on 07/20/15 of transfer.  Name of family member notified:  Dois DavenportSandra     PHYSICIAN Please sign DNR, Please sign FL2     Additional Comment:     _______________________________________________ Izora RibasHoloman, [name], LCSW 07/20/2015, 4:28 PM

## 2015-07-20 NOTE — Discharge Summary (Signed)
[facility] Discharge Summary   Patient: Rhonda Graham:[id number]   PCP: Rozanna Box, MD DOB: Oct 16, 1934   Date of admission: 07/14/2015   Date of discharge:  07/20/2015    Discharge Diagnoses:  Principal Problem:   Diverticulitis Active Problems:   Essential hypertension   Atrial fibrillation with rapid ventricular response (HCC)   Hypothyroid   HLD (hyperlipidemia)   CKD (chronic kidney disease) stage 3, GFR 30-59 ml/min   Iatrogenic hyperthyroidism   CJD (Creutzfeldt-Jakob disease)   Acute encephalopathy   Diarrhea  Recommendations for Outpatient Follow-up:  1. Please follow up with PCP in 1 week with CBC  2. Discharge to SNF 3. Please have palliative care see pt and family regarding discussion of goals of care and monitor progression of pt medical condition.   Follow-up Information    Follow up with BABAOFF, [name], MD. Schedule an appointment as soon as possible for a visit in 1 week.   Specialty:  Family Medicine   Contact information:   [address] [facility] and Internal Medicine [place] [address] [phone]      Diet recommendation: soft diet for 1 week and then advance to cardiac diet as tolerated.  Activity: The patient is advised to gradually reintroduce usual activities.  Discharge Condition: good  History of present illness: As per the H and P dictated on admission, "Past medical history significant for paroxysmal A. fib and is photic disease. Patient brought in by daughter for concerns of massive diarrhea 10 by patient. Stool was watery nonbloody. No melena. Dr. states the patient has had diverticulitis numerous times. Feels this time is worse than other occurrences. Due to that and concerns for dehydration she call EMS. Patient was then go to [facility] but EMS assessment patient had A. fib with RVR so she was brought to [facility].  Note: Patient was recently started on Seroquel for agitation last night was her  second dose"  Hospital Course:  Summary of her active problems in the hospital is as following. 1. Diverticulitis Leucocytosis.  Patient presented with abdominal pain and diarrhea. CT scan of the abdomen shows evidence of sigmoid diverticulitis without any perforation or abscess. Ultrasound of the abdomen is negative for any evidence of gallbladder infection. Renal function is also stable. Continue Probiotics.  Initially was on zosyn and now Augmentin. Tolerating soft diet. Total treatment duration 7 days.   Leukocytosis improved and then she had reoccurrence of it but no cough or abdominal pain or diarrhea or urinary symptoms reported, no fever reported. Will recommend to recheck in 1 week and continue antibiotics.  2. A. fib with RVR. CHA2DS2-VASc Score 6. Not on any anticoagulation due to dementia and high fall risk. Continue Lopressor for rate control.  Patient was initially started on heparin which have been discontinued. Patient was initially treated with adenosine without any improvement. Patient was also treated with cardioversion without any improvement. Later on she was given amiodarone bolus and was started on amiodarone drip. Amiodarone drip was discontinued and current recommendation from EP is rate control. Continue Lopressor for rate control at present.  3. Hypothyroidism. Iatrogenic hyperthyroidism. Patient is on levothyroxine 125 g. A T4 elevated free tsh suppressed. Results for DONNALEE, CELLUCCI (MRN [id number]) as of 07/20/2015 15:22  Ref. Range 07/16/2015 12:31  TSH Latest Ref Range: 0.350-4.500 uIU/mL 0.069 (L)  T4,Free(Direct) Latest Ref Range: 0.61-1.12 ng/dL 2.44 (H)   I would reduce the dose of levothyroxine 100 g. Recheck  in one month.  4. CJD Dementia with delirium. Currently stable no evidence of acute abnormality. Resume Seroquel. MRI of the brain rule out any acute intracranial abnormality. Pt has progressive neurological disorder which has not  significant treatment. Family would like the pt to be consider for palliative care support and I believe that the pt will qualify for hospice care. Please ensure that the pt and family has palliative care support at the facility to ensure adequate transition of care.  5. Hypokalemia. Stable  Magnesium level stable.  All other chronic medical condition were stable during the hospitalization.  Patient was seen by physical therapy, who recommended SNF, which was arranged by Child psychotherapist and case Production designer, theatre/television/film. On the day of the discharge the patient's vitals were stable and no significant explanation of the leucocytosis was identified, and no other acute medical condition were reported by patient. the patient was felt safe to be discharge at SNF with therapy with palliative care support.  Procedures and Results:  none3   Consultations:  Cardiology   DISCHARGE MEDICATION: Current Discharge Medication List    START taking these medications   Details  amoxicillin-clavulanate (AUGMENTIN) 500-125 MG tablet Take 1 tablet (500 mg total) by mouth every 8 (eight) hours. Qty: 11 tablet, Refills: 0    lactobacillus acidophilus (BACID) TABS tablet Take 2 tablets by mouth 2 (two) times daily. Qty: 10 tablet, Refills: 0      CONTINUE these medications which have CHANGED   Details  levothyroxine (SYNTHROID, LEVOTHROID) 100 MCG tablet Take 1 tablet (100 mcg total) by mouth daily before breakfast. Qty: 30 tablet, Refills: 0    metoprolol (LOPRESSOR) 50 MG tablet Take 1 tablet (50 mg total) by mouth 2 (two) times daily. Qty: 60 tablet, Refills: 0      CONTINUE these medications which have NOT CHANGED   Details  aspirin EC 325 MG tablet Take 325 mg by mouth daily.    QUEtiapine (SEROQUEL) 50 MG tablet Take 50 mg by mouth every evening.    simvastatin (ZOCOR) 20 MG tablet Take 20 mg by mouth daily at 6 PM.     Prenat w/o A-FE-Methf-FA-Omega (PNV-OMEGA) 28-0.6-0.4-340 MG CAPS Take 1 tablet by  mouth daily at 6 PM.       STOP taking these medications     hydrochlorothiazide (HYDRODIURIL) 12.5 MG tablet      lisinopril (PRINIVIL,ZESTRIL) 10 MG tablet        No Known Allergies Discharge Instructions    DIET SOFT    Complete by:  As directed      Increase activity slowly    Complete by:  As directed           Discharge Exam: Filed Weights   07/18/15 0355 07/19/15 0600 07/20/15 0500  Weight: 70.398 kg (155 lb 3.2 oz) 76.1 kg (167 lb 12.3 oz) 73.3 kg (161 lb 9.6 oz)   Filed Vitals:   07/20/15 0547 07/20/15 1452  BP: 140/72 139/77  Pulse:  89  Temp: 98.3 F (36.8 C) 98.4 F (36.9 C)  Resp:  17   General: Appear in no distress, no Rash; Oral Mucosa moist. Cardiovascular: S1 and S2 Present, no Murmur,  Respiratory: Bilateral Air entry present and Clear to Auscultation, no Crackles, no wheezes Abdomen: Bowel Sound present, Soft and no tenderness Extremities: no Pedal edema, no calf tenderness Neurology: Grossly no focal neuro deficit.  Examination is difficult due to profound dementia and disorientation.  The results of significant diagnostics from this hospitalization (including  imaging, microbiology, ancillary and laboratory) are listed below for reference.    Significant Diagnostic Studies: Mr Brain Wo Contrast  07/16/2015  CLINICAL DATA:  Confusion. Abdominal pain and diarrhea. Treatment for diverticulitis. EXAM: MRI HEAD WITHOUT CONTRAST TECHNIQUE: Multiplanar, multiecho pulse sequences of the brain and surrounding structures were obtained without intravenous contrast. COMPARISON:  03/24/2015 MRI from [facility]. FINDINGS: Markedly abnormal diffusion signal in the brain, widespread ribbon like cortical involvement asymmetrically affecting the LEFT hemisphere. Equal and continuous involvement of the anterior, middle, and posterior cerebral artery vascular territories is indicative of a non vascular/ non arterial phenomenon. Much smaller areas of involvement  in the RIGHT hemisphere including the medial parietal lobe, anterior RIGHT frontal lobe, also lateral superior temporal lobe. Medial thalamic involvement noted previously is less evident but present. Slight progression from priors, particularly with regard to involvement of the RIGHT hemisphere. New No mass lesion, hydrocephalus, or extra-axial fluid. Global atrophy. Moderately extensive small vessel disease. Flow voids are maintained. Normal pituitary and cerebellar tonsils. No hemorrhage. Prominent perivascular spaces. Conjugate gaze. No sinus or mastoid fluid. IMPRESSION: Although rare, the extensive abnormal diffusion signal with widespread cortical involvement and deep gray matter favors Creutzfeldt-Jacob disease. See discussion above. Mild progression, with regard to increasing involvement of the RIGHT hemisphere, from most recent brain MRI dated 03/24/2015 from outside institution. That study was performed without and with contrast, and there was no enhancement, also characteristic of CJD. Atrophy and small vessel disease. A call is in to the ordering provider. Electronically Signed   By: Elsie Stain M.D.   On: 07/16/2015 18:44   Ct Abdomen Pelvis W Contrast  07/15/2015  CLINICAL DATA:  Abdominal pain and diarrhea EXAM: CT ABDOMEN AND PELVIS WITH CONTRAST TECHNIQUE: Multidetector CT imaging of the abdomen and pelvis was performed using the standard protocol following bolus administration of intravenous contrast. CONTRAST:  75ml ISOVUE-300 IOPAMIDOL (ISOVUE-300) INJECTION 61% COMPARISON:  None. FINDINGS: Lower chest: Mild atelectatic appearing linear opacities in both bases. No consolidation. No effusion. Hepatobiliary: Mild thickening and stranding of the gallbladder, which also appears to be contracted. No conclusive calculi. No bile duct dilatation. No focal liver lesions. Pancreas: Normal Spleen: 1.4 cm hypodense focus, not characterized but more likely benign. Otherwise unremarkable. Adrenals/Urinary  Tract: The adrenals and kidneys are normal in appearance. There is no urinary calculus evident. There is no hydronephrosis or ureteral dilatation. Collecting systems and ureters appear unremarkable. Stomach/Bowel: The stomach, small bowel and appendix are normal. There is moderate colonic diverticulosis. There is inflammation centered on the appendix of the distal sigmoid, axial image 70 series 201, and this likely represents mild diverticulitis. No abscess. No extraluminal air. Vascular/Lymphatic: The abdominal aorta is normal in caliber. There is mild atherosclerotic calcification. There is no adenopathy in the abdomen or pelvis. Reproductive: Hysterectomy.  No adnexal abnormalities. Other: No ascites. Musculoskeletal: No significant musculoskeletal lesion. There is remote benign compression of L1. IMPRESSION: 1. Mild changes of acute diverticulitis, distal sigmoid colon. 2. Mild thickening and stranding of the gallbladder. This raises the question of cholecystitis although it could be a chronic cholecystitis rather than acute. Recommend right upper quadrant ultrasound for evaluation. 3. Remote benign appearing compression of L1. Electronically Signed   By: Ellery Plunk M.D.   On: 07/15/2015 04:43   US Abdomen Limited Ruq  07/15/2015  CLINICAL DATA:  Abdominal pain.  Abnormal CT. EXAM: US ABDOMEN LIMITED - RIGHT UPPER QUADRANT COMPARISON:  CT 07/15/2015 FINDINGS: Gallbladder: No cholelithiasis. No gallbladder wall thickening or pericholecystic fluid.  Common bile duct: Diameter: Normal, 1.8 mm. Liver: Diffuse parenchymal echogenicity suggesting fatty infiltration or other hepatic cellular disease. No focal liver lesion is evident. IMPRESSION: No evidence of significant gallbladder disease. There is mild hepatic echogenicity suggesting fatty infiltration. Electronically Signed   By: Ellery Plunk M.D.   On: 07/15/2015 06:00    Microbiology: Recent Results (from the past 240 hour(s))  MRSA PCR  Screening     Status: None   Collection Time: 07/15/15  2:11 PM  Result Value Ref Range Status   MRSA by PCR NEGATIVE NEGATIVE Final    Comment:        The GeneXpert MRSA Assay (FDA approved for NASAL specimens only), is one component of a comprehensive MRSA colonization surveillance program. It is not intended to diagnose MRSA infection nor to guide or monitor treatment for MRSA infections.   Gastrointestinal Panel by PCR , Stool     Status: None   Collection Time: 07/16/15 10:04 PM  Result Value Ref Range Status   Campylobacter species NOT DETECTED NOT DETECTED Final   Plesimonas shigelloides NOT DETECTED NOT DETECTED Final   Salmonella species NOT DETECTED NOT DETECTED Final   Yersinia enterocolitica NOT DETECTED NOT DETECTED Final   Vibrio species NOT DETECTED NOT DETECTED Final   Vibrio cholerae NOT DETECTED NOT DETECTED Final   Enteroaggregative E coli (EAEC) NOT DETECTED NOT DETECTED Final   Enteropathogenic E coli (EPEC) NOT DETECTED NOT DETECTED Final   Enterotoxigenic E coli (ETEC) NOT DETECTED NOT DETECTED Final   Shiga like toxin producing E coli (STEC) NOT DETECTED NOT DETECTED Final   E. coli O157 NOT DETECTED NOT DETECTED Final   Shigella/Enteroinvasive E coli (EIEC) NOT DETECTED NOT DETECTED Final   Cryptosporidium NOT DETECTED NOT DETECTED Final   Cyclospora cayetanensis NOT DETECTED NOT DETECTED Final   Entamoeba histolytica NOT DETECTED NOT DETECTED Final   Giardia lamblia NOT DETECTED NOT DETECTED Final   Adenovirus F40/41 NOT DETECTED NOT DETECTED Final   Astrovirus NOT DETECTED NOT DETECTED Final   Norovirus GI/GII NOT DETECTED NOT DETECTED Final   Rotavirus A NOT DETECTED NOT DETECTED Final   Sapovirus (I, II, IV, and V) NOT DETECTED NOT DETECTED Final  C difficile quick scan w PCR reflex     Status: None   Collection Time: 07/17/15  1:11 PM  Result Value Ref Range Status   C Diff antigen NEGATIVE NEGATIVE Final   C Diff toxin NEGATIVE NEGATIVE Final     C Diff interpretation Negative for toxigenic C. difficile  Final     Labs: CBC:  Recent Labs Lab 07/14/15 2218  07/16/15 0317 07/17/15 0407 07/18/15 0526 07/19/15 0321 07/20/15 0615  WBC 11.8*  --  10.2 10.7* 10.2 12.7* 14.3*  NEUTROABS 10.1*  --   --  7.7  --  9.4*  --   HGB 11.9*  < > 11.7* 12.0 11.9* 11.6* 12.8  HCT 35.6*  < > 36.9 36.6 37.1 36.5 40.6  MCV 87.5  --  87.2 88.4 87.9 88.2 87.9  PLT 215  --  240 232 222 227 263  < > = values in this interval not displayed. Basic Metabolic Panel:  Recent Labs Lab 07/15/15 0132 07/15/15 0849 07/16/15 0317 07/16/15 0359 07/17/15 0407 07/18/15 0526 07/19/15 0321 07/20/15 0615  NA  --   --  142  --  141 142 143 141  K  --   --  3.2*  --  4.1 4.0 3.8 3.6  CL  --   --  110  --  110 112* 110 106  CO2  --   --  22  --  22 20* 23 25  GLUCOSE  --   --  118*  --  115* 112* 99 104*  BUN  --   --  14  --  10 9 9  <5*  CREATININE  --   --  0.98  --  0.86 1.03* 0.95 0.86  CALCIUM  --   --  8.5*  --  8.8* 8.6* 8.5* 8.8*  MG 1.7  --   --  1.9  --  1.5* 1.6*  --   PHOS  --  2.4*  --   --   --   --   --   --    Liver Function Tests:  Recent Labs Lab 07/14/15 2218 07/17/15 0407 07/19/15 0321  AST 30 24 36  ALT 16 20 23   ALKPHOS 54 50 43  BILITOT 0.6 1.1 0.6  PROT 6.1* 5.7* 5.2*  ALBUMIN 3.6 3.0* 2.8*    Recent Labs Lab 07/14/15 2218  LIPASE 31   No results for input(s): AMMONIA in the last 168 hours. Cardiac Enzymes:  Recent Labs Lab 07/15/15 0849  TROPONINI <0.03   Time spent: 30 minutes  Signed:  Lynden OxfordEL, Quanta Robertshaw  [facility]  07/20/2015  , 3:03 PM

## 2015-07-22 ENCOUNTER — Non-Acute Institutional Stay (SKILLED_NURSING_FACILITY): Payer: Medicare HMO | Admitting: Internal Medicine

## 2015-07-22 ENCOUNTER — Encounter: Payer: Self-pay | Admitting: Internal Medicine

## 2015-07-22 DIAGNOSIS — E0580 Other thyrotoxicosis without thyrotoxic crisis or storm: Secondary | ICD-10-CM | POA: Diagnosis not present

## 2015-07-22 DIAGNOSIS — N183 Chronic kidney disease, stage 3 unspecified: Secondary | ICD-10-CM

## 2015-07-22 DIAGNOSIS — I482 Chronic atrial fibrillation, unspecified: Secondary | ICD-10-CM

## 2015-07-22 DIAGNOSIS — A8100 Creutzfeldt-Jakob disease, unspecified: Secondary | ICD-10-CM

## 2015-07-22 DIAGNOSIS — K5792 Diverticulitis of intestine, part unspecified, without perforation or abscess without bleeding: Secondary | ICD-10-CM

## 2015-07-22 DIAGNOSIS — R5381 Other malaise: Secondary | ICD-10-CM

## 2015-07-22 DIAGNOSIS — D72829 Elevated white blood cell count, unspecified: Secondary | ICD-10-CM

## 2015-07-22 DIAGNOSIS — F0391 Unspecified dementia with behavioral disturbance: Secondary | ICD-10-CM | POA: Diagnosis not present

## 2015-07-22 NOTE — Progress Notes (Signed)
[facility]  PCP: [name], [name], MD   Code Status: DNR  Goals of care: Advanced Directive information Advanced Directives 07/22/2015  Does patient have an advance directive? Yes  Type of Advance Directive Out of facility DNR (pink MOST or yellow form)  Does patient want to make changes to advanced directive? No - Patient declined  Copy of advanced directive(s) in chart? Yes       Extended Emergency Contact Information Primary Emergency Contact: Louison,[name] Address: [address]          [place], [place] [address] [place] Home Phone: [phone] Relation: Daughter   No Known Allergies  No chief complaint on file.    HPI:  Patient is a 81 y.o. female seen today for short term rehabilitation post hospital admission from 07/14/15-07/20/15 with acute diverticulitis and afib with RVR. She was started on antibiotics and required adenosine, cardioversion and amiodarone for her afib. Her synthroid dosing was adjusted. She has PMH of CJD, HTN, dementia, ckd stage 3 among others. She is seen in her room today. She has advanced dementia and is not able to participate in HPI and ROS.   Review of Systems: Limited due to Dementia. Obtained from patient and nursing staff.  Constitutional: Negative for fever  HENT: Negative for headache, congestion, difficulty swallowing.   Eyes: Negative for double vision and discharge.  Respiratory: Negative for cough, shortness of breath and wheezing.   Cardiovascular: Negative for chest pain, palpitations, leg swelling.  Gastrointestinal: Negative for heartburn, nausea, vomiting, abdominal pain. Musculoskeletal: Negative for fall.  Skin: Negative for itching Neurological: Negative for dizziness Psychiatric/Behavioral: has memory loss.    Past Medical History  Diagnosis Date  . Paroxysmal atrial fibrillation (HCC)   . Sinus bradycardia   . Hypothyroid   . Coronary artery disease     prior cath without  blockages per pt  . Hyperlipidemia   . Hypertension   . Stroke (HCC) 09/2008    residual balance deficit  . Hypotension   . Elevated troponin     a. 08/2014 post AF ablation.  . Obesity   . Atrial flutter (HCC)     a. Multiple atypical atrial flutter circuits, too unstable for mapping/ ablation by EP study 08/21/14.  . Atrial tachycardia (HCC)     a. Atach identified on event monitoring per Dr. Jenel LucksAllred's note.  . Hyperglycemia   . Elevated serum creatinine     a. Per labs 08/2014.  Marland Kitchen. CJD (Creutzfeldt-Jakob disease)   . Diverticulitis   . Complication of anesthesia     one time low bp & difficulty waking    Past Surgical History  Procedure Laterality Date  . Rotator cuff repair    . Ganglion cyst excision    . Knee arthroscopy    . Tee without cardioversion N/A 08/20/2014    Procedure: TRANSESOPHAGEAL ECHOCARDIOGRAM (TEE);  Surgeon: Lewayne BuntingBrian S Crenshaw, MD;  [facility];  Service: Cardiovascular;  Laterality: N/A;  . Electrophysiologic study N/A 08/21/2014    Procedure: Atrial Fibrillation Ablation;  Surgeon: Hillis RangeJames Allred, MD;  [facility];  Service: Cardiovascular;  Laterality: N/A;  . Breast cyst aspiration Right     1962 negative  . Ablation  2016   Social History:   reports that she has never smoked. She has never used smokeless tobacco. She reports that she does not drink alcohol or use illicit drugs.  Family History  Problem Relation Age of Onset  .  Stroke Mother   . Heart disease Mother   . Heart disease Father   . Stroke Father   . Seizures Sister   . Parkinsonism Brother   . Breast cancer Neg Hx     Medications:   Medication List       This list is accurate as of: 07/22/15  2:02 PM.  Always use your most recent med list.               amoxicillin-clavulanate 500-125 MG tablet  Commonly known as:  AUGMENTIN  Take 1 tablet (500 mg total) by mouth every 8 (eight) hours.     aspirin EC 325 MG tablet  Take 325 mg by mouth daily.       lactobacillus acidophilus Tabs tablet  Take 2 tablets by mouth 2 (two) times daily.     levothyroxine 100 MCG tablet  Commonly known as:  SYNTHROID, LEVOTHROID  Take 1 tablet (100 mcg total) by mouth daily before breakfast.     metoprolol 50 MG tablet  Commonly known as:  LOPRESSOR  Take 1 tablet (50 mg total) by mouth 2 (two) times daily.     PNV-OMEGA 28-0.6-0.4-340 MG Caps  Take 1 tablet by mouth daily at 6 PM.     QUEtiapine 50 MG tablet  Commonly known as:  SEROQUEL  Take 50 mg by mouth every evening.     simvastatin 20 MG tablet  Commonly known as:  ZOCOR  Take 20 mg by mouth daily at 6 PM.        Immunizations: Immunization History  Administered Date(s) Administered  . PPD Test 07/20/2015  . Pneumococcal Polysaccharide-23 07/20/2015     Physical Exam: Filed Vitals:   07/22/15 1348  BP: 145/80  Pulse: 98  Temp: 97.3 F (36.3 C)  TempSrc: Oral  Resp: 18  Height: 5\' 6"  (1.676 m)  Weight: 161 lb 9.6 oz (73.301 kg)  SpO2: 92%   Body mass index is 26.1 kg/(m^2).  General- elderly female, well built, in no acute distress Head- normocephalic, atraumatic Nose- no maxillary or frontal sinus tenderness, no nasal discharge Throat- moist mucus membrane Eyes- PERRLA, EOMI, no pallor, no icterus, no discharge, normal conjunctiva, normal sclera Neck- no cervical lymphadenopathy Cardiovascular- irregular heart rate, no murmur Respiratory- bilateral clear to auscultation, no wheeze, no rhonchi, no crackles, no use of accessory muscles Abdomen- bowel sounds present, soft, mild tenderness to her lower quadrants on palpation, no guarding or rigidity Musculoskeletal- able to move all 4 extremities, generalized weakness, on wheelchair Neurological- alert and oriented to self only Skin- warm and dry   Labs reviewed: Basic Metabolic Panel:  Recent Labs  16/10/96 0849  07/16/15 0359  07/18/15 0526 07/19/15 0321 07/20/15 07/20/15 0615  NA  --   < >  --   < > 142  143 141 141  K  --   < >  --   < > 4.0 3.8  --  3.6  CL  --   < >  --   < > 112* 110  --  106  CO2  --   < >  --   < > 20* 23  --  25  GLUCOSE  --   < >  --   < > 112* 99  --  104*  BUN  --   < >  --   < > 9 9  --  <5*  CREATININE  --   < >  --   < >  1.03* 0.95 0.9 0.86  CALCIUM  --   < >  --   < > 8.6* 8.5*  --  8.8*  MG  --   --  1.9  --  1.5* 1.6*  --   --   PHOS 2.4*  --   --   --   --   --   --   --   < > = values in this interval not displayed. Liver Function Tests:  Recent Labs  07/14/15 2218 07/17/15 0407 07/19/15 0321  AST 30 24 36  ALT ALKPHOS 54 50 43  BILITOT 0.6 1.1 0.6  PROT 6.1* 5.7* 5.2*  ALBUMIN 3.6 3.0* 2.8*    Recent Labs  07/14/15 2218  LIPASE 31   No results for input(s): AMMONIA in the last 8760 hours. CBC:  Recent Labs  07/14/15 2218  07/17/15 0407 07/18/15 0526 07/19/15 0321 07/20/15 07/20/15 0615  WBC 11.8*  < > 10.7* 10.2 12.7* 14.3 14.3*  NEUTROABS 10.1*  --  7.7  --  9.4*  --   --   HGB 11.9*  < > 12.0 11.9* 11.6*  --  12.8  HCT 35.6*  < > 36.6 37.1 36.5  --  40.6  MCV 87.5  < > 88.4 87.9 88.2  --  87.9  PLT 215  < > 232 222 227  --  263  < > = values in this interval not displayed. Cardiac Enzymes:  Recent Labs  08/22/14 0258 08/22/14 0850 07/15/15 0849  TROPONINI 1.81* 3.06* <0.03   BNP: Invalid input(s): POCBNP CBG:  Recent Labs  08/23/14 0839  GLUCAP 129*    Radiological Exams: Mr Brain Wo Contrast  07/16/2015  CLINICAL DATA:  Confusion. Abdominal pain and diarrhea. Treatment for diverticulitis. EXAM: MRI HEAD WITHOUT CONTRAST TECHNIQUE: Multiplanar, multiecho pulse sequences of the brain and surrounding structures were obtained without intravenous contrast. COMPARISON:  03/24/2015 MRI from [facility]. FINDINGS: Markedly abnormal diffusion signal in the brain, widespread ribbon like cortical involvement asymmetrically affecting the LEFT hemisphere. Equal and continuous involvement of the anterior,  middle, and posterior cerebral artery vascular territories is indicative of a non vascular/ non arterial phenomenon. Much smaller areas of involvement in the RIGHT hemisphere including the medial parietal lobe, anterior RIGHT frontal lobe, also lateral superior temporal lobe. Medial thalamic involvement noted previously is less evident but present. Slight progression from priors, particularly with regard to involvement of the RIGHT hemisphere. New No mass lesion, hydrocephalus, or extra-axial fluid. Global atrophy. Moderately extensive small vessel disease. Flow voids are maintained. Normal pituitary and cerebellar tonsils. No hemorrhage. Prominent perivascular spaces. Conjugate gaze. No sinus or mastoid fluid. IMPRESSION: Although rare, the extensive abnormal diffusion signal with widespread cortical involvement and deep gray matter favors Creutzfeldt-Jacob disease. See discussion above. Mild progression, with regard to increasing involvement of the RIGHT hemisphere, from most recent brain MRI dated 03/24/2015 from outside institution. That study was performed without and with contrast, and there was no enhancement, also characteristic of CJD. Atrophy and small vessel disease. A call is in to the ordering provider. Electronically Signed   By: Elsie Stain M.D.   On: 07/16/2015 18:44   Ct Abdomen Pelvis W Contrast  07/15/2015  CLINICAL DATA:  Abdominal pain and diarrhea EXAM: CT ABDOMEN AND PELVIS WITH CONTRAST TECHNIQUE: Multidetector CT imaging of the abdomen and pelvis was performed using the standard protocol following bolus administration of intravenous contrast. CONTRAST:  75ml ISOVUE-300 IOPAMIDOL (ISOVUE-300) INJECTION 61% COMPARISON:  None. FINDINGS: Lower chest: Mild atelectatic appearing linear opacities in both bases. No consolidation. No effusion. Hepatobiliary: Mild thickening and stranding of the gallbladder, which also appears to be contracted. No conclusive calculi. No bile duct dilatation. No  focal liver lesions. Pancreas: Normal Spleen: 1.4 cm hypodense focus, not characterized but more likely benign. Otherwise unremarkable. Adrenals/Urinary Tract: The adrenals and kidneys are normal in appearance. There is no urinary calculus evident. There is no hydronephrosis or ureteral dilatation. Collecting systems and ureters appear unremarkable. Stomach/Bowel: The stomach, small bowel and appendix are normal. There is moderate colonic diverticulosis. There is inflammation centered on the appendix of the distal sigmoid, axial image 70 series 201, and this likely represents mild diverticulitis. No abscess. No extraluminal air. Vascular/Lymphatic: The abdominal aorta is normal in caliber. There is mild atherosclerotic calcification. There is no adenopathy in the abdomen or pelvis. Reproductive: Hysterectomy.  No adnexal abnormalities. Other: No ascites. Musculoskeletal: No significant musculoskeletal lesion. There is remote benign compression of L1. IMPRESSION: 1. Mild changes of acute diverticulitis, distal sigmoid colon. 2. Mild thickening and stranding of the gallbladder. This raises the question of cholecystitis although it could be a chronic cholecystitis rather than acute. Recommend right upper quadrant ultrasound for evaluation. 3. Remote benign appearing compression of L1. Electronically Signed   By: Ellery Plunk M.D.   On: 07/15/2015 04:43   Dg Abd Portable 1v  07/20/2015  CLINICAL DATA:  Diarrhea with mild hematochezia.  Pain EXAM: PORTABLE ABDOMEN - 1 VIEW COMPARISON:  CT abdomen and pelvis July 15, 2015 FINDINGS: There is no bowel dilatation or air-fluid level suggesting obstruction. No free air. There are foci of vascular calcification in the right pelvis. There is degenerative change in the pubic symphysis region. IMPRESSION: Bowel gas pattern unremarkable. No demonstrable obstruction or free air. Electronically Signed   By: Bretta Bang III M.D.   On: 07/20/2015 16:22   US Abdomen  Limited Ruq  07/15/2015  CLINICAL DATA:  Abdominal pain.  Abnormal CT. EXAM: US ABDOMEN LIMITED - RIGHT UPPER QUADRANT COMPARISON:  CT 07/15/2015 FINDINGS: Gallbladder: No cholelithiasis. No gallbladder wall thickening or pericholecystic fluid. Common bile duct: Diameter: Normal, 1.8 mm. Liver: Diffuse parenchymal echogenicity suggesting fatty infiltration or other hepatic cellular disease. No focal liver lesion is evident. IMPRESSION: No evidence of significant gallbladder disease. There is mild hepatic echogenicity suggesting fatty infiltration. Electronically Signed   By: Ellery Plunk M.D.   On: 07/15/2015 06:00    Assessment/Plan  Physical deconditioning Will have her work with physical therapy and occupational therapy team to help with gait training and muscle strengthening exercises.fall precautions. Skin care. Encourage to be out of bed. Given her overall poor prognosis, get palliative care consult  Acute diverticulitis Continue and complete course of augmentin until 07/24/15, monitor cbc with diff and temp curve.  afib with RVR Rate controlled at present. Continue lopressor 50 mg bid and aspirin  Leukocytosis Monitor cbc with diff, currently on antibiotic for diverticulitis. Monitor temp curve  Iatrogenic hyperthyroidism Has clinical Hypothyroidism and was on higher dosing of levothyroxine. Her levothyroxine dose was reduced in the hospita to 100 mcg. Check tsh with free t4 in 4 weeks Lab Results  Component Value Date   TSH 0.069* 07/16/2015    CJD Continue supportive care. With her overall poor prognosis, get palliative care consult  Dementia with delirium Continue seroquel in the evening, continue supportive care. Get palliative care consult. Fall precautions, pressure ulcer prophylaxis   ckd stage 3 Monitor renal function  Goals of care: short term rehabilitation   Labs/tests ordered: cbc, cmp 07/26/15. tsh and free t4 in 4 weeks  Family/ staff Communication:  reviewed care plan with nursing supervisor    Oneal Grout, MD Internal Medicine [facility] [address] [place], [place] [address] Cell Phone (Monday-Friday 8 am - 5 pm): [phone] On Call: [phone] and follow prompts after 5 pm and on weekends Office Phone: [phone] Office Fax: [phone]

## 2015-07-23 ENCOUNTER — Emergency Department (HOSPITAL_COMMUNITY)
Admission: EM | Admit: 2015-07-23 | Discharge: 2015-07-24 | Disposition: A | Discharge status: Home / Self Care | Payer: Medicare HMO | Attending: Emergency Medicine | Admitting: Emergency Medicine

## 2015-07-23 ENCOUNTER — Emergency Department (HOSPITAL_COMMUNITY): Payer: Medicare HMO

## 2015-07-23 ENCOUNTER — Encounter: Payer: Self-pay | Admitting: Family

## 2015-07-23 ENCOUNTER — Non-Acute Institutional Stay (SKILLED_NURSING_FACILITY): Payer: Medicare HMO | Admitting: Family

## 2015-07-23 ENCOUNTER — Encounter (HOSPITAL_COMMUNITY): Payer: Self-pay | Admitting: *Deleted

## 2015-07-23 DIAGNOSIS — G3183 Dementia with Lewy bodies: Secondary | ICD-10-CM | POA: Diagnosis not present

## 2015-07-23 DIAGNOSIS — E785 Hyperlipidemia, unspecified: Secondary | ICD-10-CM

## 2015-07-23 DIAGNOSIS — I2510 Atherosclerotic heart disease of native coronary artery without angina pectoris: Secondary | ICD-10-CM | POA: Insufficient documentation

## 2015-07-23 DIAGNOSIS — Z7982 Long term (current) use of aspirin: Secondary | ICD-10-CM | POA: Diagnosis not present

## 2015-07-23 DIAGNOSIS — I480 Paroxysmal atrial fibrillation: Secondary | ICD-10-CM

## 2015-07-23 DIAGNOSIS — I4892 Unspecified atrial flutter: Secondary | ICD-10-CM | POA: Diagnosis not present

## 2015-07-23 DIAGNOSIS — Z8673 Personal history of transient ischemic attack (TIA), and cerebral infarction without residual deficits: Secondary | ICD-10-CM | POA: Diagnosis not present

## 2015-07-23 DIAGNOSIS — E039 Hypothyroidism, unspecified: Secondary | ICD-10-CM | POA: Insufficient documentation

## 2015-07-23 DIAGNOSIS — N183 Chronic kidney disease, stage 3 unspecified: Secondary | ICD-10-CM

## 2015-07-23 DIAGNOSIS — I10 Essential (primary) hypertension: Secondary | ICD-10-CM

## 2015-07-23 DIAGNOSIS — R600 Localized edema: Secondary | ICD-10-CM | POA: Diagnosis not present

## 2015-07-23 DIAGNOSIS — K5732 Diverticulitis of large intestine without perforation or abscess without bleeding: Secondary | ICD-10-CM

## 2015-07-23 DIAGNOSIS — E669 Obesity, unspecified: Secondary | ICD-10-CM | POA: Insufficient documentation

## 2015-07-23 DIAGNOSIS — Z8719 Personal history of other diseases of the digestive system: Secondary | ICD-10-CM | POA: Insufficient documentation

## 2015-07-23 DIAGNOSIS — F0390 Unspecified dementia without behavioral disturbance: Secondary | ICD-10-CM | POA: Diagnosis not present

## 2015-07-23 DIAGNOSIS — Z79899 Other long term (current) drug therapy: Secondary | ICD-10-CM | POA: Diagnosis not present

## 2015-07-23 DIAGNOSIS — F0280 Dementia in other diseases classified elsewhere without behavioral disturbance: Secondary | ICD-10-CM

## 2015-07-23 DIAGNOSIS — M25473 Effusion, unspecified ankle: Secondary | ICD-10-CM

## 2015-07-23 DIAGNOSIS — R609 Edema, unspecified: Secondary | ICD-10-CM

## 2015-07-23 DIAGNOSIS — M7989 Other specified soft tissue disorders: Secondary | ICD-10-CM | POA: Diagnosis present

## 2015-07-23 LAB — BASIC METABOLIC PANEL
ANION GAP: 12 (ref 5–15)
BUN: 12 mg/dL (ref 6–20)
CALCIUM: 9.1 mg/dL (ref 8.9–10.3)
CO2: 22 mmol/L (ref 22–32)
Chloride: 108 mmol/L (ref 101–111)
Creatinine, Ser: 0.85 mg/dL (ref 0.44–1.00)
GLUCOSE: 146 mg/dL — AB (ref 65–99)
POTASSIUM: 3.4 mmol/L — AB (ref 3.5–5.1)
SODIUM: 142 mmol/L (ref 135–145)

## 2015-07-23 LAB — CBC
HEMATOCRIT: 38.2 % (ref 36.0–46.0)
HEMOGLOBIN: 12.2 g/dL (ref 12.0–15.0)
MCH: 28.2 pg (ref 26.0–34.0)
MCHC: 31.9 g/dL (ref 30.0–36.0)
MCV: 88.4 fL (ref 78.0–100.0)
Platelets: 278 10*3/uL (ref 150–400)
RBC: 4.32 MIL/uL (ref 3.87–5.11)
RDW: 13.9 % (ref 11.5–15.5)
WBC: 10 10*3/uL (ref 4.0–10.5)

## 2015-07-23 LAB — I-STAT TROPONIN, ED: TROPONIN I, POC: 0.01 ng/mL (ref 0.00–0.08)

## 2015-07-23 NOTE — ED Notes (Signed)
Family wanting to leave with pt.  Encouraged them to stay and explained triage process and wait for treatment room.

## 2015-07-23 NOTE — ED Notes (Addendum)
Pt brought here by her daughter for BIL LE edema.  Was just [facility] hours ago and daughter noticed there was swelling that was not there earlier today.  Pt denies sob.  Hx of advanced demential.  Family also states cough.

## 2015-07-23 NOTE — Progress Notes (Signed)
Location:  [facility] Room Number: 105 Place of Service:  SNF [phone])  Provider: Richarda Blade, NP Oneal Grout, MD   PCP: Rozanna Box, MD Patient Care Team: Kandyce Rud, MD as PCP - General (Family Medicine)  Extended Emergency Contact Information Primary Emergency Contact: Knebel,[name] Address: [address]          [place], [place] [address] [place] Home Phone: [phone] Relation: Daughter  Code Status: DNR Goals of care:  Advanced Directive information Advanced Directives 07/23/2015  Does patient have an advance directive? Yes  Type of Advance Directive Out of facility DNR (pink MOST or yellow form)  Does patient want to make changes to advanced directive? -  Copy of advanced directive(s) in chart? Yes     No Known Allergies  Chief Complaint  Patient presents with  . Discharge Note    Discharged from SNF    HPI:  81 y.o. female  Seen today at  [facility] for discharge home. She was here for short term rehabilitation post hospital admission from 07/14/15-07/20/15 with acute diverticulitis and afib with RVR. She was started on antibiotics and required adenosine, cardioversion and amiodarone for her afib. She has a medical history of HTN,stroke, CAD, Afib, Dementia, Hypothyroidism, CKD among others. She is seen in her room today with daughter at the bedside. She has one more day of antibiotics now stable for discharge home. HPI and ROS limited due to presence of dementia. Patient's daughter states patient does not need any DME has own WC, 3-1 Of note she states she will be picking up  Hospital bed script from patient's PCP. No home health services required but states will consider Hospice service arrangement later at home. All medication scripts written x 1 month. Current last dose of antibiotics x 1 day will be provided by the facility during discharge.     Past Medical History  Diagnosis Date  .  Paroxysmal atrial fibrillation (HCC)   . Sinus bradycardia   . Hypothyroid   . Coronary artery disease     prior cath without blockages per pt  . Hyperlipidemia   . Hypertension   . Stroke (HCC) 09/2008    residual balance deficit  . Hypotension   . Elevated troponin     a. 08/2014 post AF ablation.  . Obesity   . Atrial flutter (HCC)     a. Multiple atypical atrial flutter circuits, too unstable for mapping/ ablation by EP study 08/21/14.  . Atrial tachycardia (HCC)     a. Atach identified on event monitoring per Dr. Jenel Lucks note.  . Hyperglycemia   . Elevated serum creatinine     a. Per labs 08/2014.  Marland Kitchen CJD (Creutzfeldt-Jakob disease)   . Diverticulitis   . Complication of anesthesia     one time low bp & difficulty waking     Past Surgical History  Procedure Laterality Date  . Rotator cuff repair    . Ganglion cyst excision    . Knee arthroscopy    . Tee without cardioversion N/A 08/20/2014    Procedure: TRANSESOPHAGEAL ECHOCARDIOGRAM (TEE);  Surgeon: Lewayne Bunting, MD;  Location: [facility] ENDOSCOPY;  Service: Cardiovascular;  Laterality: N/A;  . Electrophysiologic study N/A 08/21/2014    Procedure: Atrial Fibrillation Ablation;  Surgeon: Hillis Range, MD;  Location: [facility] INVASIVE CV LAB;  Service: Cardiovascular;  Laterality: N/A;  . Breast cyst aspiration Right     1962 negative  .  Ablation  2016      reports that she has never smoked. She has never used smokeless tobacco. She reports that she does not drink alcohol or use illicit drugs. Social History   Social History  . Marital Status: Single    Spouse Name: N/A  . Number of Children: N/A  . Years of Education: N/A   Occupational History  . Not on file.   Social History Main Topics  . Smoking status: Never Smoker   . Smokeless tobacco: Never Used  . Alcohol Use: No  . Drug Use: No  . Sexual Activity: Not on file   Other Topics Concern  . Not on file   Social History Narrative   Pt lives in [place] alone.   Divorced   Retired from Warehouse manager work   Functional Status Survey:    No Known Allergies  Pertinent  Health Maintenance Due  Topic Date Due  . DEXA SCAN  08/06/2001  . INFLUENZA VACCINE  11/02/2015  . PNA vac Low Risk Adult (2 of 2 - PCV13) 07/19/2016    Medications:   Medication List       This list is accurate as of: 07/23/15  2:26 PM.  Always use your most recent med list.               amoxicillin-clavulanate 500-125 MG tablet  Commonly known as:  AUGMENTIN  Take 1 tablet (500 mg total) by mouth every 8 (eight) hours.     aspirin EC 325 MG tablet  Take 325 mg by mouth daily.     lactobacillus acidophilus Tabs tablet  Take 2 tablets by mouth 2 (two) times daily.     levothyroxine 100 MCG tablet  Commonly known as:  SYNTHROID, LEVOTHROID  Take 1 tablet (100 mcg total) by mouth daily before breakfast.     Melatonin 3 MG Tabs  Take 1 tablet by mouth at bedtime.     metoprolol 50 MG tablet  Commonly known as:  LOPRESSOR  Take 1 tablet (50 mg total) by mouth 2 (two) times daily.     nystatin cream  Commonly known as:  MYCOSTATIN  Apply 1 application topically 2 (two) times daily. Bid to to buttock crease perianal and groin for fungal rash     PNV-OMEGA 28-0.6-0.4-340 MG Caps  Take 1 tablet by mouth daily at 6 PM.     QUEtiapine 50 MG tablet  Commonly known as:  SEROQUEL  Take 50 mg by mouth every evening.     simvastatin 20 MG tablet  Commonly known as:  ZOCOR  Take 20 mg by mouth daily at 6 PM.        Review of Systems  Unable to perform ROS: Dementia    Filed Vitals:   07/23/15 1409  BP: 134/72  Pulse: 88  Temp: 97.7 F (36.5 C)  Resp: 18  Height: 5\' 6"  (1.676 m)  Weight: 155 lb (70.308 kg)  SpO2: 93%   Body mass index is 25.03 kg/(m^2). Physical Exam  Constitutional: She appears well-nourished. No distress.  Elderly pleasantly confused.  HENT:  Head: Normocephalic.  Mouth/Throat: Oropharynx is clear and moist.  Eyes: Conjunctivae  and EOM are normal. Pupils are equal, round, and reactive to light. Right eye exhibits no discharge. Left eye exhibits no discharge. No scleral icterus.  Neck: Normal range of motion. No JVD present. No thyromegaly present.  Cardiovascular: Normal rate, regular rhythm, normal heart sounds and intact distal pulses.  Exam reveals no gallop and  no friction rub.   No murmur heard. Pulmonary/Chest: Effort normal and breath sounds normal. No respiratory distress. She has no wheezes. She has no rales.  Abdominal: Soft. Bowel sounds are normal. She exhibits no distension. There is no tenderness. There is no rebound and no guarding.  Musculoskeletal: Normal range of motion. She exhibits no edema.  Lymphadenopathy:    She has no cervical adenopathy.  Neurological: She is alert.  Skin: Skin is warm and dry. No rash noted. No erythema. No pallor.  Psychiatric:  Pleasantly confused    Labs reviewed: Basic Metabolic Panel:  Recent Labs  40/98/11 0849  07/16/15 0359  07/18/15 0526 07/19/15 0321 07/20/15 07/20/15 0615  NA  --   < >  --   < > 142 143 141 141  K  --   < >  --   < > 4.0 3.8  --  3.6  CL  --   < >  --   < > 112* 110  --  106  CO2  --   < >  --   < > 20* 23  --  25  GLUCOSE  --   < >  --   < > 112* 99  --  104*  BUN  --   < >  --   < > 9 9  --  <5*  CREATININE  --   < >  --   < > 1.03* 0.95 0.9 0.86  CALCIUM  --   < >  --   < > 8.6* 8.5*  --  8.8*  MG  --   --  1.9  --  1.5* 1.6*  --   --   PHOS 2.4*  --   --   --   --   --   --   --   < > = values in this interval not displayed. Liver Function Tests:  Recent Labs  07/14/15 2218 07/17/15 0407 07/19/15 0321  AST 30 24 36  ALT ALKPHOS 54 50 43  BILITOT 0.6 1.1 0.6  PROT 6.1* 5.7* 5.2*  ALBUMIN 3.6 3.0* 2.8*    Recent Labs  07/14/15 2218  LIPASE 31   No results for input(s): AMMONIA in the last 8760 hours. CBC:  Recent Labs  07/14/15 2218  07/17/15 0407 07/18/15 0526 07/19/15 0321 07/20/15  07/20/15 0615  WBC 11.8*  < > 10.7* 10.2 12.7* 14.3 14.3*  NEUTROABS 10.1*  --  7.7  --  9.4*  --   --   HGB 11.9*  < > 12.0 11.9* 11.6*  --  12.8  HCT 35.6*  < > 36.6 37.1 36.5  --  40.6  MCV 87.5  < > 88.4 87.9 88.2  --  87.9  PLT 215  < > 232 222 227  --  263  < > = values in this interval not displayed. Cardiac Enzymes:  Recent Labs  08/22/14 0258 08/22/14 0850 07/15/15 0849  TROPONINI 1.81* 3.06* <0.03   BNP: Invalid input(s): POCBNP CBG:  Recent Labs  08/23/14 0839  GLUCAP 129*    Procedures and Imaging Studies During Stay: Mr Brain Wo Contrast  07/16/2015  CLINICAL DATA:  Confusion. Abdominal pain and diarrhea. Treatment for diverticulitis. EXAM: MRI HEAD WITHOUT CONTRAST TECHNIQUE: Multiplanar, multiecho pulse sequences of the brain and surrounding structures were obtained without intravenous contrast. COMPARISON:  03/24/2015 MRI from [facility]. FINDINGS: Markedly abnormal diffusion signal in the brain, widespread ribbon like  cortical involvement asymmetrically affecting the LEFT hemisphere. Equal and continuous involvement of the anterior, middle, and posterior cerebral artery vascular territories is indicative of a non vascular/ non arterial phenomenon. Much smaller areas of involvement in the RIGHT hemisphere including the medial parietal lobe, anterior RIGHT frontal lobe, also lateral superior temporal lobe. Medial thalamic involvement noted previously is less evident but present. Slight progression from priors, particularly with regard to involvement of the RIGHT hemisphere. New No mass lesion, hydrocephalus, or extra-axial fluid. Global atrophy. Moderately extensive small vessel disease. Flow voids are maintained. Normal pituitary and cerebellar tonsils. No hemorrhage. Prominent perivascular spaces. Conjugate gaze. No sinus or mastoid fluid. IMPRESSION: Although rare, the extensive abnormal diffusion signal with widespread cortical involvement and deep gray matter  favors Creutzfeldt-Jacob disease. See discussion above. Mild progression, with regard to increasing involvement of the RIGHT hemisphere, from most recent brain MRI dated 03/24/2015 from outside institution. That study was performed without and with contrast, and there was no enhancement, also characteristic of CJD. Atrophy and small vessel disease. A call is in to the ordering provider. Electronically Signed   By: Elsie Stain M.D.   On: 07/16/2015 18:44   Ct Abdomen Pelvis W Contrast  07/15/2015  CLINICAL DATA:  Abdominal pain and diarrhea EXAM: CT ABDOMEN AND PELVIS WITH CONTRAST TECHNIQUE: Multidetector CT imaging of the abdomen and pelvis was performed using the standard protocol following bolus administration of intravenous contrast. CONTRAST:  75ml ISOVUE-300 IOPAMIDOL (ISOVUE-300) INJECTION 61% COMPARISON:  None. FINDINGS: Lower chest: Mild atelectatic appearing linear opacities in both bases. No consolidation. No effusion. Hepatobiliary: Mild thickening and stranding of the gallbladder, which also appears to be contracted. No conclusive calculi. No bile duct dilatation. No focal liver lesions. Pancreas: Normal Spleen: 1.4 cm hypodense focus, not characterized but more likely benign. Otherwise unremarkable. Adrenals/Urinary Tract: The adrenals and kidneys are normal in appearance. There is no urinary calculus evident. There is no hydronephrosis or ureteral dilatation. Collecting systems and ureters appear unremarkable. Stomach/Bowel: The stomach, small bowel and appendix are normal. There is moderate colonic diverticulosis. There is inflammation centered on the appendix of the distal sigmoid, axial image 70 series 201, and this likely represents mild diverticulitis. No abscess. No extraluminal air. Vascular/Lymphatic: The abdominal aorta is normal in caliber. There is mild atherosclerotic calcification. There is no adenopathy in the abdomen or pelvis. Reproductive: Hysterectomy.  No adnexal abnormalities.  Other: No ascites. Musculoskeletal: No significant musculoskeletal lesion. There is remote benign compression of L1. IMPRESSION: 1. Mild changes of acute diverticulitis, distal sigmoid colon. 2. Mild thickening and stranding of the gallbladder. This raises the question of cholecystitis although it could be a chronic cholecystitis rather than acute. Recommend right upper quadrant ultrasound for evaluation. 3. Remote benign appearing compression of L1. Electronically Signed   By: Ellery Plunk M.D.   On: 07/15/2015 04:43   Dg Abd Portable 1v  07/20/2015  CLINICAL DATA:  Diarrhea with mild hematochezia.  Pain EXAM: PORTABLE ABDOMEN - 1 VIEW COMPARISON:  CT abdomen and pelvis July 15, 2015 FINDINGS: There is no bowel dilatation or air-fluid level suggesting obstruction. No free air. There are foci of vascular calcification in the right pelvis. There is degenerative change in the pubic symphysis region. IMPRESSION: Bowel gas pattern unremarkable. No demonstrable obstruction or free air. Electronically Signed   By: Bretta Bang III M.D.   On: 07/20/2015 16:22   US Abdomen Limited Ruq  07/15/2015  CLINICAL DATA:  Abdominal pain.  Abnormal CT. EXAM: US ABDOMEN LIMITED -  RIGHT UPPER QUADRANT COMPARISON:  CT 07/15/2015 FINDINGS: Gallbladder: No cholelithiasis. No gallbladder wall thickening or pericholecystic fluid. Common bile duct: Diameter: Normal, 1.8 mm. Liver: Diffuse parenchymal echogenicity suggesting fatty infiltration or other hepatic cellular disease. No focal liver lesion is evident. IMPRESSION: No evidence of significant gallbladder disease. There is mild hepatic echogenicity suggesting fatty infiltration. Electronically Signed   By: Ellery Plunkaniel R Mitchell M.D.   On: 07/15/2015 06:00    Assessment/Plan:   Diverticulitis  S/p short term rehabilitation post hospital admission from 07/14/15-07/20/15 with acute diverticulitis and afib with RVR. She was started on antibiotics. Has one more day of  Augmentin 500/125 mg Tablet Q 8 Hrs and Bacid. D/C home with medication from facility.   HTN B/p stable. Continue Metoprolol 50 mg Tablet. PCP to monitor BMP.   Afib  HR controlled. Continue Metoprolol 50 mg Tablet  Hypothyroidism  Continue on Levothyroxine 100 mcg Tablet. PCP to monitor TSH  CKD stage 3 Avoid nephrotoxins and dose all other medications for renal clearance. Monitor BMP  Hyperlipidemia  Continue on simvastatin 20 mg Tablet. PCP to monitor Lipid panel  Dementia  No new behavioral issues. Continue to monitor. Assist with ADL's as needed. Fall and safety precautions. Skin Care.   Patient is being discharged with the following home health services:    No home health services required but patient's daughter states will consider Hospice service arrangement later at home.   Patient is being discharged with the following durable medical equipment:   Patient's daughter states patient does not need any DME has own WC, 3-1 Of note she states she will be picking up  Hospital bed script from patient's PCP.  All medication scripts written x 1 month. Current last dose of antibiotics x 1 day will be provided by the facility during discharge.    Patient has been advised to f/u with their PCP in 1-2 weeks to bring them up to date on their rehab stay.  Social services at facility was responsible for arranging this appointment.  Pt was provided with a 30 day supply of prescriptions for medications and refills must be obtained from their PCP.  For controlled substances, a more limited supply may be provided adequate until PCP appointment only.  Future labs/tests needed:  CBC, BMP, TSH, Lipid panel with PCP

## 2015-07-24 MED ORDER — FUROSEMIDE 20 MG PO TABS: 20.0000 mg | ORAL_TABLET | Freq: Every day | ORAL | Status: DC

## 2015-07-24 NOTE — ED Notes (Signed)
Pt departed in NAD.  

## 2015-07-24 NOTE — Discharge Instructions (Signed)
Lasix as prescribed.  Follow-up with Dr. Johney FrameAllred in the next week for a recheck.  Return to the ER if symptoms significantly worsen or change.   Edema Edema is an abnormal buildup of fluids in your bodytissues. Edema is somewhatdependent on gravity to pull the fluid to the lowest place in your body. That makes the condition more common in the legs and thighs (lower extremities). Painless swelling of the feet and ankles is common and becomes more likely as you get older. It is also common in looser tissues, like around your eyes.  When the affected area is squeezed, the fluid may move out of that spot and leave a dent for a few moments. This dent is called pitting.  CAUSES  There are many possible causes of edema. Eating too much salt and being on your feet or sitting for a long time can cause edema in your legs and ankles. Hot weather may make edema worse. Common medical causes of edema include:  Heart failure.  Liver disease.  Kidney disease.  Weak blood vessels in your legs.  Cancer.  An injury.  Pregnancy.  Some medications.  Obesity. SYMPTOMS  Edema is usually painless.Your skin may look swollen or shiny.  DIAGNOSIS  Your health care provider may be able to diagnose edema by asking about your medical history and doing a physical exam. You may need to have tests such as X-rays, an electrocardiogram, or blood tests to check for medical conditions that may cause edema.  TREATMENT  Edema treatment depends on the cause. If you have heart, liver, or kidney disease, you need the treatment appropriate for these conditions. General treatment may include:  Elevation of the affected body part above the level of your heart.  Compression of the affected body part. Pressure from elastic bandages or support stockings squeezes the tissues and forces fluid back into the blood vessels. This keeps fluid from entering the tissues.  Restriction of fluid and salt intake.  Use of a water  pill (diuretic). These medications are appropriate only for some types of edema. They pull fluid out of your body and make you urinate more often. This gets rid of fluid and reduces swelling, but diuretics can have side effects. Only use diuretics as directed by your health care provider. HOME CARE INSTRUCTIONS   Keep the affected body part above the level of your heart when you are lying down.   Do not sit still or stand for prolonged periods.   Do not put anything directly under your knees when lying down.  Do not wear constricting clothing or garters on your upper legs.   Exercise your legs to work the fluid back into your blood vessels. This may help the swelling go down.   Wear elastic bandages or support stockings to reduce ankle swelling as directed by your health care provider.   Eat a low-salt diet to reduce fluid if your health care provider recommends it.   Only take medicines as directed by your health care provider. SEEK MEDICAL CARE IF:   Your edema is not responding to treatment.  You have heart, liver, or kidney disease and notice symptoms of edema.  You have edema in your legs that does not improve after elevating them.   You have sudden and unexplained weight gain. SEEK IMMEDIATE MEDICAL CARE IF:   You develop shortness of breath or chest pain.   You cannot breathe when you lie down.  You develop pain, redness, or warmth in the swollen  areas.   You have heart, liver, or kidney disease and suddenly get edema.  You have a fever and your symptoms suddenly get worse. MAKE SURE YOU:   Understand these instructions.  Will watch your condition.  Will get help right away if you are not doing well or get worse.   This information is not intended to replace advice given to you by your health care provider. Make sure you discuss any questions you have with your health care provider.   Document Released: 03/20/2005 Document Revised: 04/10/2014 Document  Reviewed: 01/10/2013 Elsevier Interactive Patient Education [employer]

## 2015-07-25 NOTE — ED Provider Notes (Signed)
CSN: [id number]     Arrival date & time 07/23/15  1811 History   First MD Initiated Contact with Patient 07/23/15 2305     Chief Complaint  Patient presents with  . Leg Swelling     (Consider location/radiation/quality/duration/timing/severity/associated sxs/prior Treatment) HPI Comments: Patient is a 81 year old female with past medical history of dementia. She was recently admitted for a flareup of diverticulitis or gastroenteritis. She was discharged to an extended care facility for rehabilitation. Daughter was unhappy with the care she was receiving there and signed her out and brought her home. Shortly after arriving home, the daughter noticed that she had some swelling of her ankles and feet and brings her for evaluation of this. The patient adds very little additional history secondary to dementia and the fact that she had received some sort of sedative prior to bedtime.  The history is provided by the patient and a relative.    Past Medical History  Diagnosis Date  . Paroxysmal atrial fibrillation (HCC)   . Sinus bradycardia   . Hypothyroid   . Coronary artery disease     prior cath without blockages per pt  . Hyperlipidemia   . Hypertension   . Stroke (HCC) 09/2008    residual balance deficit  . Hypotension   . Elevated troponin     a. 08/2014 post AF ablation.  . Obesity   . Atrial flutter (HCC)     a. Multiple atypical atrial flutter circuits, too unstable for mapping/ ablation by EP study 08/21/14.  . Atrial tachycardia (HCC)     a. Atach identified on event monitoring per Dr. Jenel LucksAllred's note.  . Hyperglycemia   . Elevated serum creatinine     a. Per labs 08/2014.  Marland Kitchen. CJD (Creutzfeldt-Jakob disease)   . Diverticulitis   . Complication of anesthesia     one time low bp & difficulty waking    Past Surgical History  Procedure Laterality Date  . Rotator cuff repair    . Ganglion cyst excision    . Knee arthroscopy    . Tee without cardioversion N/A 08/20/2014   Procedure: TRANSESOPHAGEAL ECHOCARDIOGRAM (TEE);  Surgeon: Lewayne BuntingBrian S Crenshaw, MD;  [facility];  Service: Cardiovascular;  Laterality: N/A;  . Electrophysiologic study N/A 08/21/2014    Procedure: Atrial Fibrillation Ablation;  Surgeon: Hillis RangeJames Allred, MD;  [facility];  Service: Cardiovascular;  Laterality: N/A;  . Breast cyst aspiration Right     1962 negative  . Ablation  2016   Family History  Problem Relation Age of Onset  . Stroke Mother   . Heart disease Mother   . Heart disease Father   . Stroke Father   . Seizures Sister   . Parkinsonism Brother   . Breast cancer Neg Hx    Social History  Substance Use Topics  . Smoking status: Never Smoker   . Smokeless tobacco: Never Used  . Alcohol Use: No   OB History    No data available     Review of Systems  Unable to perform ROS: Dementia      Allergies  Review of patient's allergies indicates no known allergies.  Home Medications   Prior to Admission medications   Medication Sig Start Date End Date Taking? Authorizing Provider  aspirin EC 325 MG tablet Take 325 mg by mouth daily.     Historical Provider, MD  furosemide (LASIX) 20 MG tablet Take 1 tablet (20 mg total) by mouth daily. 07/24/15   Riley Lamouglas  Eloise Mula, MD  lactobacillus acidophilus (BACID) TABS tablet Take 2 tablets by mouth 2 (two) times daily. 07/20/15 07/25/15  Rolly Salter, MD  levothyroxine (SYNTHROID, LEVOTHROID) 100 MCG tablet Take 1 tablet (100 mcg total) by mouth daily before breakfast. 07/20/15   Rolly Salter, MD  Melatonin 3 MG TABS Take 1 tablet by mouth at bedtime.    Historical Provider, MD  metoprolol (LOPRESSOR) 50 MG tablet Take 1 tablet (50 mg total) by mouth 2 (two) times daily. 07/20/15   Rolly Salter, MD  nystatin cream (MYCOSTATIN) Apply 1 application topically 2 (two) times daily. Bid to to buttock crease perianal and groin for fungal rash    Historical Provider, MD  Prenat w/o A-FE-Methf-FA-Omega (PNV-OMEGA)  28-0.6-0.4-340 MG CAPS Take 1 tablet by mouth daily at 6 PM.     Historical Provider, MD  QUEtiapine (SEROQUEL) 50 MG tablet Take 50 mg by mouth every evening.    Historical Provider, MD  simvastatin (ZOCOR) 20 MG tablet Take 20 mg by mouth daily at 6 PM.     Historical Provider, MD   BP 105/53 mmHg  Pulse 87  Temp(Src) 97.6 F (36.4 C) (Oral)  Resp 26  SpO2 97% Physical Exam  Constitutional: She is oriented to person, place, and time. She appears well-developed and well-nourished. No distress.  HENT:  Head: Normocephalic and atraumatic.  Neck: Normal range of motion. Neck supple.  Cardiovascular: Normal rate and regular rhythm.  Exam reveals no gallop and no friction rub.   No murmur heard. Pulmonary/Chest: Effort normal and breath sounds normal. No respiratory distress. She has no wheezes.  Abdominal: Soft. Bowel sounds are normal. She exhibits no distension. There is no tenderness.  Musculoskeletal: Normal range of motion. She exhibits edema.  There is trace edema of the bilateral lower extremities. There is no calf tenderness and Homans sign is absent bilaterally. DP pulses are easily palpable bilaterally.  Neurological: She is alert and oriented to person, place, and time.  Skin: Skin is warm and dry. She is not diaphoretic.  Nursing note and vitals reviewed.   ED Course  Procedures (including critical care time) Labs Review Labs Reviewed  BASIC METABOLIC PANEL - Abnormal; Notable for the following:    Potassium 3.4 (*)    Glucose, Bld 146 (*)    All other components within normal limits  CBC  I-STAT TROPOININ, ED    Imaging Review No results found. I have personally reviewed and evaluated these images and lab results as part of my medical decision-making.   EKG Interpretation   Date/Time:  Friday July 23 2015 19:05:01 EDT Ventricular Rate:  120 PR Interval:    QRS Duration: 76 QT Interval:  328 QTC Calculation: 463 R Axis:   53 Text Interpretation:  Atrial  fibrillation with rapid ventricular response  Anterior infarct , age undetermined ST \\T \ T wave abnormality, consider  inferolateral ischemia Abnormal ECG No significant change from prior ecg  Confirmed by Dniyah Grant  MD, Riley Lam ([id number]) on 07/24/2015 12:07:26 AM Also  confirmed by Judd Lien  MD, Tariq Pernell ([id number]), editor Whitney Post, Cala Bradford [phone])  on  07/24/2015 12:21:22 PM      MDM   Final diagnoses:  Ankle edema    The patient's physical examination and workup is not consistent with congestive heart failure. She has never had this in the past. There is no hypoxia and she is resting very comfortably. She does have atrial fibrillation, however this was present upon discharge and her rate is controlled. She  appears to be in the same condition that she was when she left the hospital several days ago. I see no indication for readmission. I've discussed this with the patient's daughter who is present at bedside. She is requesting that her mother be admitted, however I see no medical reason for this. She will be discharged to home, to follow-up with her primary doctor next week.    Geoffery Lyons, MD 07/25/15 2352

## 2015-09-09 ENCOUNTER — Emergency Department (HOSPITAL_COMMUNITY): Payer: Medicare HMO

## 2015-09-09 ENCOUNTER — Inpatient Hospital Stay (HOSPITAL_COMMUNITY)
Admission: EM | Admit: 2015-09-09 | Discharge: 2015-09-14 | DRG: 309 | Disposition: A | Discharge status: Skilled Nursing Facility | Payer: Medicare HMO | Attending: Internal Medicine | Admitting: Internal Medicine

## 2015-09-09 ENCOUNTER — Encounter (HOSPITAL_COMMUNITY): Payer: Self-pay | Admitting: Emergency Medicine

## 2015-09-09 DIAGNOSIS — R000 Tachycardia, unspecified: Secondary | ICD-10-CM | POA: Diagnosis present

## 2015-09-09 DIAGNOSIS — E785 Hyperlipidemia, unspecified: Secondary | ICD-10-CM | POA: Diagnosis present

## 2015-09-09 DIAGNOSIS — Z515 Encounter for palliative care: Secondary | ICD-10-CM | POA: Insufficient documentation

## 2015-09-09 DIAGNOSIS — Z8249 Family history of ischemic heart disease and other diseases of the circulatory system: Secondary | ICD-10-CM

## 2015-09-09 DIAGNOSIS — I480 Paroxysmal atrial fibrillation: Secondary | ICD-10-CM | POA: Diagnosis present

## 2015-09-09 DIAGNOSIS — E440 Moderate protein-calorie malnutrition: Secondary | ICD-10-CM | POA: Diagnosis present

## 2015-09-09 DIAGNOSIS — I2510 Atherosclerotic heart disease of native coronary artery without angina pectoris: Secondary | ICD-10-CM | POA: Diagnosis present

## 2015-09-09 DIAGNOSIS — Z8673 Personal history of transient ischemic attack (TIA), and cerebral infarction without residual deficits: Secondary | ICD-10-CM

## 2015-09-09 DIAGNOSIS — E039 Hypothyroidism, unspecified: Secondary | ICD-10-CM | POA: Diagnosis present

## 2015-09-09 DIAGNOSIS — Z6825 Body mass index (BMI) 25.0-25.9, adult: Secondary | ICD-10-CM

## 2015-09-09 DIAGNOSIS — Z7982 Long term (current) use of aspirin: Secondary | ICD-10-CM

## 2015-09-09 DIAGNOSIS — Z820 Family history of epilepsy and other diseases of the nervous system: Secondary | ICD-10-CM

## 2015-09-09 DIAGNOSIS — F0280 Dementia in other diseases classified elsewhere without behavioral disturbance: Secondary | ICD-10-CM | POA: Diagnosis present

## 2015-09-09 DIAGNOSIS — N390 Urinary tract infection, site not specified: Secondary | ICD-10-CM | POA: Diagnosis present

## 2015-09-09 DIAGNOSIS — Z1623 Resistance to quinolones and fluoroquinolones: Secondary | ICD-10-CM | POA: Diagnosis present

## 2015-09-09 DIAGNOSIS — I4891 Unspecified atrial fibrillation: Secondary | ICD-10-CM | POA: Diagnosis present

## 2015-09-09 DIAGNOSIS — Z823 Family history of stroke: Secondary | ICD-10-CM

## 2015-09-09 DIAGNOSIS — Z66 Do not resuscitate: Secondary | ICD-10-CM | POA: Diagnosis present

## 2015-09-09 DIAGNOSIS — R509 Fever, unspecified: Secondary | ICD-10-CM | POA: Diagnosis not present

## 2015-09-09 DIAGNOSIS — A8100 Creutzfeldt-Jakob disease, unspecified: Secondary | ICD-10-CM | POA: Diagnosis not present

## 2015-09-09 DIAGNOSIS — I10 Essential (primary) hypertension: Secondary | ICD-10-CM | POA: Diagnosis not present

## 2015-09-09 DIAGNOSIS — E038 Other specified hypothyroidism: Secondary | ICD-10-CM

## 2015-09-09 DIAGNOSIS — Z7189 Other specified counseling: Secondary | ICD-10-CM | POA: Insufficient documentation

## 2015-09-09 DIAGNOSIS — R0682 Tachypnea, not elsewhere classified: Secondary | ICD-10-CM | POA: Diagnosis present

## 2015-09-09 DIAGNOSIS — Z79899 Other long term (current) drug therapy: Secondary | ICD-10-CM

## 2015-09-09 LAB — COMPREHENSIVE METABOLIC PANEL
ALK PHOS: 70 U/L (ref 38–126)
ALT: 13 U/L — AB (ref 14–54)
ANION GAP: 9 (ref 5–15)
AST: 26 U/L (ref 15–41)
Albumin: 3.8 g/dL (ref 3.5–5.0)
BILIRUBIN TOTAL: 0.5 mg/dL (ref 0.3–1.2)
BUN: 12 mg/dL (ref 6–20)
CALCIUM: 9.1 mg/dL (ref 8.9–10.3)
CO2: 27 mmol/L (ref 22–32)
CREATININE: 0.76 mg/dL (ref 0.44–1.00)
Chloride: 101 mmol/L (ref 101–111)
Glucose, Bld: 115 mg/dL — ABNORMAL HIGH (ref 65–99)
Potassium: 3.8 mmol/L (ref 3.5–5.1)
Sodium: 137 mmol/L (ref 135–145)
TOTAL PROTEIN: 7 g/dL (ref 6.5–8.1)

## 2015-09-09 LAB — I-STAT CG4 LACTIC ACID, ED: Lactic Acid, Venous: 1.23 mmol/L (ref 0.5–2.0)

## 2015-09-09 LAB — CBC WITH DIFFERENTIAL/PLATELET
BASOS ABS: 0 10*3/uL (ref 0.0–0.1)
BASOS PCT: 0 %
EOS ABS: 0.1 10*3/uL (ref 0.0–0.7)
Eosinophils Relative: 1 %
HEMATOCRIT: 38.5 % (ref 36.0–46.0)
HEMOGLOBIN: 12.6 g/dL (ref 12.0–15.0)
Lymphocytes Relative: 22 %
Lymphs Abs: 2.4 10*3/uL (ref 0.7–4.0)
MCH: 28.3 pg (ref 26.0–34.0)
MCHC: 32.7 g/dL (ref 30.0–36.0)
MCV: 86.5 fL (ref 78.0–100.0)
MONO ABS: 0.9 10*3/uL (ref 0.1–1.0)
Monocytes Relative: 8 %
NEUTROS ABS: 7.2 10*3/uL (ref 1.7–7.7)
NEUTROS PCT: 69 %
Platelets: 269 10*3/uL (ref 150–400)
RBC: 4.45 MIL/uL (ref 3.87–5.11)
RDW: 13.5 % (ref 11.5–15.5)
WBC: 10.5 10*3/uL (ref 4.0–10.5)

## 2015-09-09 LAB — URINALYSIS, ROUTINE W REFLEX MICROSCOPIC
BILIRUBIN URINE: NEGATIVE
Glucose, UA: NEGATIVE mg/dL
Hgb urine dipstick: NEGATIVE
KETONES UR: NEGATIVE mg/dL
LEUKOCYTES UA: NEGATIVE
NITRITE: NEGATIVE
PH: 7.5 (ref 5.0–8.0)
Protein, ur: NEGATIVE mg/dL
Specific Gravity, Urine: 1.008 (ref 1.005–1.030)

## 2015-09-09 LAB — LIPASE, BLOOD: LIPASE: 23 U/L (ref 11–51)

## 2015-09-09 MED ORDER — SODIUM CHLORIDE 0.9 % IV BOLUS (SEPSIS)
1000.0000 mL | Freq: Once | INTRAVENOUS | Status: AC
  Administered 2015-09-09: 1000 mL via INTRAVENOUS

## 2015-09-09 MED ORDER — DILTIAZEM HCL 100 MG IV SOLR
5.0000 mg/h | Freq: Once | INTRAVENOUS | Status: AC
  Administered 2015-09-09: 5 mg/h via INTRAVENOUS
  Filled 2015-09-09: qty 100

## 2015-09-09 MED ORDER — IOPAMIDOL (ISOVUE-300) INJECTION 61%
100.0000 mL | Freq: Once | INTRAVENOUS | Status: AC | PRN
  Administered 2015-09-09: 100 mL via INTRAVENOUS

## 2015-09-09 MED ORDER — ACETAMINOPHEN 160 MG/5ML PO SOLN
650.0000 mg | Freq: Once | ORAL | Status: AC
  Administered 2015-09-09: 650 mg via ORAL
  Filled 2015-09-09: qty 20.3

## 2015-09-09 MED ORDER — CEFTRIAXONE SODIUM 1 G IJ SOLR
1.0000 g | Freq: Once | INTRAMUSCULAR | Status: AC
  Administered 2015-09-09: 1 g via INTRAVENOUS
  Filled 2015-09-09: qty 10

## 2015-09-09 MED ORDER — SODIUM CHLORIDE 0.9 % IV BOLUS (SEPSIS)
250.0000 mL | Freq: Once | INTRAVENOUS | Status: AC
  Administered 2015-09-09: 250 mL via INTRAVENOUS

## 2015-09-09 NOTE — ED Notes (Signed)
[facility] Expected date:  Expected time:  Means of arrival:  Comments: Dementia, UTI

## 2015-09-09 NOTE — ED Provider Notes (Signed)
CSN: [id number]     Arrival date & time 09/09/15  2015 History   First MD Initiated Contact with Patient 09/09/15 2029     Chief Complaint  Patient presents with  . Urinary Tract Infection     Patient is a 81 y.o. female presenting with urinary tract infection. The history is provided by a relative. No language interpreter was used.  Urinary Tract Infection  Rhonda Graham is a 81 y.o. female who presents to the Emergency Department complaining of fever.  Level V caveat due to dementia.  History is provided by the patient's daughter. The patient has a history of CJD and has had a progressive decline since December. The daughter reports that over the last week she has been less interactive and nonverbal. Today she began a fever to 100.9. No vomiting, cough, diarrhea. The daughter states that her urine was very dark today. She was recently seen by her primary care provider for urinary tract infection. She was started on Cipro at the end of May but was told to stop that medication because of a resistant infection and the medication was changed to Macrobid a few days ago.  Past Medical History  Diagnosis Date  . Paroxysmal atrial fibrillation (HCC)   . Sinus bradycardia   . Hypothyroid   . Coronary artery disease     prior cath without blockages per pt  . Hyperlipidemia   . Hypertension   . Stroke (HCC) 09/2008    residual balance deficit  . Hypotension   . Elevated troponin     a. 08/2014 post AF ablation.  . Obesity   . Atrial flutter (HCC)     a. Multiple atypical atrial flutter circuits, too unstable for mapping/ ablation by EP study 08/21/14.  . Atrial tachycardia (HCC)     a. Atach identified on event monitoring per Dr. Jenel Lucks note.  . Hyperglycemia   . Elevated serum creatinine     a. Per labs 08/2014.  Marland Kitchen CJD (Creutzfeldt-Jakob disease)   . Diverticulitis   . Complication of anesthesia     one time low bp & difficulty waking    Past Surgical History  Procedure Laterality Date   . Rotator cuff repair    . Ganglion cyst excision    . Knee arthroscopy    . Tee without cardioversion N/A 08/20/2014    Procedure: TRANSESOPHAGEAL ECHOCARDIOGRAM (TEE);  Surgeon: Lewayne Bunting, MD;  [facility];  Service: Cardiovascular;  Laterality: N/A;  . Electrophysiologic study N/A 08/21/2014    Procedure: Atrial Fibrillation Ablation;  Surgeon: Hillis Range, MD;  Location: [facility] INVASIVE CV LAB;  Service: Cardiovascular;  Laterality: N/A;  . Breast cyst aspiration Right     1962 negative  . Ablation  2016   Family History  Problem Relation Age of Onset  . Stroke Mother   . Heart disease Mother   . Heart disease Father   . Stroke Father   . Seizures Sister   . Parkinsonism Brother   . Breast cancer Neg Hx    Social History  Substance Use Topics  . Smoking status: Never Smoker   . Smokeless tobacco: Never Used  . Alcohol Use: No   OB History    No data available     Review of Systems  Unable to perform ROS: Patient nonverbal      Allergies  Review of patient's allergies indicates no known allergies.  Home Medications   Prior to Admission medications   Medication Sig Start  Date End Date Taking? Authorizing Provider  ALPRAZolam (XANAX) 0.5 MG tablet TK 1 T PO Q NIGHT PRF SLEEP 09/04/15  Yes Historical Provider, MD  aspirin EC 325 MG tablet Take 325 mg by mouth daily.    Yes Historical Provider, MD  diltiazem (CARDIZEM CD) 120 MG 24 hr capsule Take 120 mg by mouth daily.  08/12/15 08/11/16 Yes Historical Provider, MD  levothyroxine (SYNTHROID, LEVOTHROID) 125 MCG tablet TK 1 T PO QD 08/16/15  Yes Historical Provider, MD  Melatonin 1 MG/ML LIQD Take 1.5 mLs by mouth at bedtime.    Yes Historical Provider, MD  metoprolol (LOPRESSOR) 50 MG tablet Take 1 tablet (50 mg total) by mouth 2 (two) times daily. 07/20/15  Yes Rolly SalterPranav M Patel, MD  metoprolol succinate (TOPROL-XL) 25 MG 24 hr tablet Take 12.5 mg by mouth daily.  08/12/15  Yes Historical Provider, MD   nitrofurantoin, macrocrystal-monohydrate, (MACROBID) 100 MG capsule Take 100 mg by mouth 2 (two) times daily.  09/06/15  Yes Historical Provider, MD  QUEtiapine (SEROQUEL) 50 MG tablet Take 50 mg by mouth every evening.   Yes Historical Provider, MD  simvastatin (ZOCOR) 20 MG tablet Take 20 mg by mouth daily at 6 PM.    Yes Historical Provider, MD  furosemide (LASIX) 20 MG tablet Take 1 tablet (20 mg total) by mouth daily. Patient not taking: Reported on 09/09/2015 07/24/15   Geoffery Lyonsouglas Delo, MD  levothyroxine (SYNTHROID, LEVOTHROID) 100 MCG tablet Take 1 tablet (100 mcg total) by mouth daily before breakfast. Patient not taking: Reported on 09/09/2015 07/20/15   Rolly SalterPranav M Patel, MD  nystatin cream (MYCOSTATIN) Apply 1 application topically 2 (two) times daily. Bid to to buttock crease perianal and groin for fungal rash    Historical Provider, MD   BP 158/106 mmHg  Pulse 75  Temp(Src) 101.7 F (38.7 C) (Rectal)  Resp 26  Wt 154 lb 15.7 oz (70.3 kg)  SpO2 98% Physical Exam  Constitutional: She appears well-developed and well-nourished.  HENT:  Head: Normocephalic and atraumatic.  Cardiovascular: Regular rhythm.   No murmur heard. Tachycardic  Pulmonary/Chest: Effort normal and breath sounds normal. No respiratory distress.  Abdominal: Soft. There is no rebound and no guarding.  Moderate epigastric and left upper quadrant tenderness  Musculoskeletal: She exhibits no edema or tenderness.  Neurological: She is alert.  Nonverbal. Confused. Moves all extremities symmetrically.  Skin: Skin is warm and dry.  Psychiatric: She has a normal mood and affect. Her behavior is normal.  Nursing note and vitals reviewed.   ED Course  Procedures (including critical care time) Labs Review Labs Reviewed  COMPREHENSIVE METABOLIC PANEL - Abnormal; Notable for the following:    Glucose, Bld 115 (*)    ALT 13 (*)    All other components within normal limits  CULTURE, BLOOD (ROUTINE X 2)  CULTURE, BLOOD  (ROUTINE X 2)  URINE CULTURE  CBC WITH DIFFERENTIAL/PLATELET  URINALYSIS, ROUTINE W REFLEX MICROSCOPIC (NOT AT [facility])  LIPASE, BLOOD  I-STAT CG4 LACTIC ACID, ED  I-STAT CG4 LACTIC ACID, ED    Imaging Review Ct Abdomen Pelvis W Contrast  09/09/2015  CLINICAL DATA:  Abdominal pain and fever.  UTI. EXAM: CT ABDOMEN AND PELVIS WITH CONTRAST TECHNIQUE: Multidetector CT imaging of the abdomen and pelvis was performed using the standard protocol following bolus administration of intravenous contrast. CONTRAST:  100mL ISOVUE-300 IOPAMIDOL (ISOVUE-300) INJECTION 61% COMPARISON:  07/15/2015 FINDINGS: Lower chest and abdominal wall:  Biatrial enlargement. Mild dependent atelectasis, also seen previously Hepatobiliary:  No focal liver abnormality.No evidence of biliary obstruction or stone. Pancreas: Unremarkable. Spleen: Benign-appearing 1 cm low-density inferiorly, stable. Adrenals/Urinary Tract: Negative adrenals. No hydronephrosis or stone. No visible pyelonephritis. Unremarkable bladder. Reproductive:Hysterectomy with negative adnexae. Stomach/Bowel: No obstruction. Distal colonic diverticulosis without active inflammation. No appendicitis. Vascular/Lymphatic: No acute vascular abnormality. No mass or adenopathy. Peritoneal: No ascites or pneumoperitoneum. Musculoskeletal: Remote L1 compression fracture with moderate height loss and mild retropulsion. No acute osseous finding. IMPRESSION: 1. No acute finding or explanation for fever. 2. Chronic findings are stable from prior and described above. Electronically Signed   By: Marnee Spring M.D.   On: 09/09/2015 23:26   Dg Chest Port 1 View  09/09/2015  CLINICAL DATA:  Left rib tenderness in shortness of breath EXAM: PORTABLE CHEST 1 VIEW COMPARISON:  07/23/2015 FINDINGS: The heart size and mediastinal contours are within normal limits. Both lungs are clear. The visualized skeletal structures are unremarkable. IMPRESSION: No active disease. Electronically Signed    By: Elige Ko   On: 09/09/2015 21:31   I have personally reviewed and evaluated these images and lab results as part of my medical decision-making.   EKG Interpretation   Date/Time:  Thursday September 09 2015 20:59:02 EDT Ventricular Rate:  168 PR Interval:    QRS Duration: 70 QT Interval:  265 QTC Calculation: 443 R Axis:   54 Text Interpretation:  Atrial fibrillation with rapid V-rate Ventricular  premature complex Low voltage, extremity leads Anteroseptal infarct, old  Repolarization abnormality, prob rate related Confirmed by Lincoln Brigham  [phone]) on 09/09/2015 9:10:35 PM      MDM   Final diagnoses:  Fever, unspecified fever cause    Here for evaluation of fever and mental decline over the last several days. She is febrile in the emergency department and noted to be in A. fib with RVR. Her fever was treated and she was given IV fluids, antibiotics for concern for underlying UTI based on history. UA with no evidence of infection. CT scan of her abdomen was obtained given her abdominal tenderness on exam with no acute abnormalities. She is also noted to be in A. fib with RVR, will treat with diltiazem. Hospitals contacted for admission for further treatment.    Tilden Fossa, MD 09/09/15 [phone]

## 2015-09-09 NOTE — ED Notes (Signed)
Bed: WA17 Expected date:  Expected time:  Means of arrival:  Comments: Del[facility] PostHall C

## 2015-09-09 NOTE — ED Notes (Signed)
Pt in X-Ray ?

## 2015-09-09 NOTE — ED Notes (Signed)
Patient presents from home via EMS for UTI x1 week. Family reports temp 100.9 at home. No relief with nitrofurantin. Family requesting another antibiotic.   Last VS: 156/92, 94hr, 28resp, 100.9

## 2015-09-10 ENCOUNTER — Encounter (HOSPITAL_COMMUNITY): Payer: Self-pay | Admitting: Family Medicine

## 2015-09-10 DIAGNOSIS — I4891 Unspecified atrial fibrillation: Secondary | ICD-10-CM | POA: Diagnosis present

## 2015-09-10 DIAGNOSIS — E440 Moderate protein-calorie malnutrition: Secondary | ICD-10-CM | POA: Diagnosis present

## 2015-09-10 LAB — BASIC METABOLIC PANEL
ANION GAP: 6 (ref 5–15)
BUN: 11 mg/dL (ref 6–20)
CO2: 25 mmol/L (ref 22–32)
Calcium: 8.3 mg/dL — ABNORMAL LOW (ref 8.9–10.3)
Chloride: 109 mmol/L (ref 101–111)
Creatinine, Ser: 0.61 mg/dL (ref 0.44–1.00)
GFR calc Af Amer: >60 mL/min (ref >60)
GLUCOSE: 122 mg/dL — AB (ref 65–99)
POTASSIUM: 3.4 mmol/L — AB (ref 3.5–5.1)
Sodium: 140 mmol/L (ref 135–145)

## 2015-09-10 LAB — TSH: TSH: 0.297 u[IU]/mL — ABNORMAL LOW (ref 0.350–4.500)

## 2015-09-10 LAB — MRSA PCR SCREENING: MRSA by PCR: NEGATIVE

## 2015-09-10 LAB — PROTIME-INR
INR: 1.16 (ref 0.00–1.49)
PROTHROMBIN TIME: 15 s (ref 11.6–15.2)

## 2015-09-10 LAB — T4, FREE: FREE T4: 0.96 ng/dL (ref 0.61–1.12)

## 2015-09-10 LAB — APTT: aPTT: 32 seconds (ref 24–37)

## 2015-09-10 LAB — PROCALCITONIN: Procalcitonin: <0.1 ng/mL

## 2015-09-10 MED ORDER — LEVOTHYROXINE SODIUM 100 MCG PO TABS
125.0000 ug | ORAL_TABLET | Freq: Every day | ORAL | Status: DC
  Administered 2015-09-10 – 2015-09-14 (×5): 125 ug via ORAL
  Filled 2015-09-10 (×6): qty 1

## 2015-09-10 MED ORDER — DILTIAZEM HCL 100 MG IV SOLR
5.0000 mg/h | INTRAVENOUS | Status: DC
  Administered 2015-09-10: 5 mg/h via INTRAVENOUS
  Filled 2015-09-10: qty 100

## 2015-09-10 MED ORDER — ASPIRIN EC 325 MG PO TBEC
325.0000 mg | DELAYED_RELEASE_TABLET | Freq: Every day | ORAL | Status: DC
  Administered 2015-09-10 – 2015-09-12 (×2): 325 mg via ORAL
  Filled 2015-09-10 (×2): qty 1

## 2015-09-10 MED ORDER — SIMVASTATIN 20 MG PO TABS
20.0000 mg | ORAL_TABLET | Freq: Every day | ORAL | Status: DC
  Filled 2015-09-10: qty 1

## 2015-09-10 MED ORDER — QUETIAPINE FUMARATE 25 MG PO TABS
50.0000 mg | ORAL_TABLET | Freq: Every evening | ORAL | Status: DC
  Administered 2015-09-10 – 2015-09-13 (×4): 50 mg via ORAL
  Filled 2015-09-10 (×4): qty 2

## 2015-09-10 MED ORDER — ENOXAPARIN SODIUM 40 MG/0.4ML ~~LOC~~ SOLN
40.0000 mg | SUBCUTANEOUS | Status: DC
  Administered 2015-09-10 – 2015-09-14 (×5): 40 mg via SUBCUTANEOUS
  Filled 2015-09-10 (×5): qty 0.4

## 2015-09-10 MED ORDER — ATORVASTATIN CALCIUM 10 MG PO TABS
10.0000 mg | ORAL_TABLET | Freq: Every day | ORAL | Status: DC
  Administered 2015-09-10 – 2015-09-13 (×4): 10 mg via ORAL
  Filled 2015-09-10 (×4): qty 1

## 2015-09-10 MED ORDER — ACETAMINOPHEN 325 MG PO TABS
650.0000 mg | ORAL_TABLET | ORAL | Status: DC | PRN
  Administered 2015-09-12 – 2015-09-13 (×2): 650 mg via ORAL
  Filled 2015-09-10 (×3): qty 2

## 2015-09-10 MED ORDER — METOPROLOL TARTRATE 50 MG PO TABS
50.0000 mg | ORAL_TABLET | Freq: Two times a day (BID) | ORAL | Status: DC
  Administered 2015-09-10 – 2015-09-14 (×9): 50 mg via ORAL
  Filled 2015-09-10 (×8): qty 1
  Filled 2015-09-10: qty 2

## 2015-09-10 MED ORDER — ALPRAZOLAM 0.5 MG PO TABS: 0.5000 mg | ORAL_TABLET | Freq: Three times a day (TID) | ORAL | Status: DC | PRN

## 2015-09-10 MED ORDER — DEXTROSE 5 % IV SOLN
2.0000 g | INTRAVENOUS | Status: DC
  Administered 2015-09-10: 2 g via INTRAVENOUS
  Filled 2015-09-10: qty 2

## 2015-09-10 MED ORDER — DILTIAZEM HCL 30 MG PO TABS
30.0000 mg | ORAL_TABLET | Freq: Four times a day (QID) | ORAL | Status: DC
  Administered 2015-09-10 – 2015-09-14 (×17): 30 mg via ORAL
  Filled 2015-09-10 (×17): qty 1

## 2015-09-10 MED ORDER — SODIUM CHLORIDE 0.9 % IV BOLUS (SEPSIS)
1000.0000 mL | Freq: Once | INTRAVENOUS | Status: AC
  Administered 2015-09-10: 1000 mL via INTRAVENOUS

## 2015-09-10 MED ORDER — CEFTRIAXONE SODIUM 2 G IJ SOLR: 2.0000 g | INTRAMUSCULAR | Status: DC

## 2015-09-10 MED ORDER — ENSURE ENLIVE PO LIQD
237.0000 mL | ORAL | Status: DC
  Administered 2015-09-10 – 2015-09-14 (×4): 237 mL via ORAL

## 2015-09-10 MED ORDER — ONDANSETRON HCL 4 MG/2ML IJ SOLN: 4.0000 mg | Freq: Four times a day (QID) | INTRAMUSCULAR | Status: DC | PRN

## 2015-09-10 NOTE — [place] FL2 (Signed)
[place] MEDICAID FL2 LEVEL OF CARE SCREENING TOOL     IDENTIFICATION  Patient Name: Rhonda Graham Birthdate: Apr 03, 1935 Sex: female Admission Date (Current Location): 09/09/2015  [facility] [place] and [place] Number:  Chiropodist and Address:  [facility],  [address] [place], [place] [address]      Provider Number: [id number]  Attending Physician Name and Address:  Mir Vergie Living,*  Relative Name and Phone Number:  [name], daughter, [phone]    Current Level of Care: Hospital Recommended Level of Care: Skilled Nursing Facility Prior Approval Number:    Date Approved/Denied:   PASRR Number: [id number]  Discharge Plan: SNF    Current Diagnoses: Patient Active Problem List   Diagnosis Date Noted  . Atrial fibrillation with RVR (HCC) 09/10/2015  . Malnutrition of moderate degree 09/10/2015  . Fever 09/09/2015  . CKD (chronic kidney disease) stage 3, GFR 30-59 ml/min 07/16/2015  . Iatrogenic hyperthyroidism 07/16/2015  . CJD (Creutzfeldt-Jakob disease) 07/16/2015  . Acute encephalopathy   . Diarrhea   . Atrial fibrillation with rapid ventricular response (HCC) 07/15/2015  . Diverticulitis 07/15/2015  . Hypothyroid 07/15/2015  . HLD (hyperlipidemia) 07/15/2015  . Postprocedural hypotension   . Elevated troponin   . A-fib (HCC) 08/21/2014  . Paroxysmal atrial fibrillation (HCC) 07/29/2014  . Morbid obesity (HCC) 07/29/2014  . Essential hypertension 07/29/2014    Orientation RESPIRATION BLADDER Height & Weight     Self  Normal Incontinent Weight: 149 lb 11.1 oz (67.9 kg) Height:   (162.6 cm)  BEHAVIORAL SYMPTOMS/MOOD NEUROLOGICAL BOWEL NUTRITION STATUS      Continent Diet (Regular)  AMBULATORY STATUS COMMUNICATION OF NEEDS Skin   Extensive Assist Verbally Normal                       Personal Care Assistance Level of Assistance  Feeding, Bathing, Dressing Bathing Assistance: Limited assistance Feeding assistance: Limited  assistance Dressing Assistance: Limited assistance     Functional Limitations Info             SPECIAL CARE FACTORS FREQUENCY  OT (By licensed OT), PT (By licensed PT)     PT Frequency: 5 OT Frequency: 5            Contractures      Additional Factors Info  Code Status, Allergies Code Status Info: DNR Allergies Info: NKDA           Current Medications (09/10/2015):  This is the current hospital active medication list Current Facility-Administered Medications  Medication Dose Route Frequency Provider Last Rate Last Dose  . acetaminophen (TYLENOL) tablet 650 mg  650 mg Oral Q4H PRN Briscoe Deutscher, MD      . ALPRAZolam Prudy Feeler) tablet 0.5 mg  0.5 mg Oral TID PRN Briscoe Deutscher, MD      . aspirin EC tablet 325 mg  325 mg Oral Daily Briscoe Deutscher, MD   325 mg at 09/10/15 0919  . atorvastatin (LIPITOR) tablet 10 mg  10 mg Oral q1800 Mir Vergie Living, MD      . cefTRIAXone (ROCEPHIN) 2 g in dextrose 5 % 50 mL IVPB  2 g Intravenous Q24H Christine E Shade, RPH      . diltiazem (CARDIZEM) tablet 30 mg  30 mg Oral Q6H Mir Vergie Living, MD   30 mg at 09/10/15 1144  . enoxaparin (LOVENOX) injection 40 mg  40 mg Subcutaneous Q24H Briscoe Deutscher, MD   40 mg at 09/10/15  [id number]  . feeding supplement (ENSURE ENLIVE) (ENSURE ENLIVE) liquid 237 mL  237 mL Oral Q24H Mir Vergie LivingMohammed Ikramullah, MD   237 mL at 09/10/15 1144  . levothyroxine (SYNTHROID, LEVOTHROID) tablet 125 mcg  125 mcg Oral QAC breakfast Briscoe Deutscherimothy S Opyd, MD   125 mcg at 09/10/15 0919  . metoprolol tartrate (LOPRESSOR) tablet 50 mg  50 mg Oral BID Briscoe Deutscherimothy S Opyd, MD   50 mg at 09/10/15 0919  . ondansetron (ZOFRAN) injection 4 mg  4 mg Intravenous Q6H PRN Lavone Neriimothy S Opyd, MD      . QUEtiapine (SEROQUEL) tablet 50 mg  50 mg Oral QPM Briscoe Deutscherimothy S Opyd, MD         Discharge Medications: Please see discharge summary for a list of discharge medications.  Relevant Imaging Results:  Relevant Lab Results:   Additional  Information SSN: [id number]  Arlyss RepressHarrison, Tamarcus Condie F, LCSW

## 2015-09-10 NOTE — Clinical Social Work Note (Signed)
Clinical Social Work Assessment  Patient Details  Name: Rhonda Graham MRN: [id number] Date of Birth: 03/31/1935  Date of referral:  09/10/15               Reason for consult:  Facility Placement                Permission sought to share information with:  Chartered certified accountant granted to share information::  Yes, Verbal Permission Granted  Name::        Agency::     Relationship::     Contact Information:     Housing/Transportation Living arrangements for the past 2 months:  Single Family Home Source of Information:  Adult Children Patient Interpreter Needed:  None Criminal Activity/Legal Involvement Pertinent to Current Situation/Hospitalization:  No - Comment as needed Significant Relationships:  Adult Children Lives with:  Adult Children Do you feel safe going back to the place where you live?  No Need for family participation in patient care:  Yes (Comment)  Care giving concerns:  CSW reviewed PT evaluation recommending SNF at discharge.    Social Worker assessment / plan:  CSW met with patient's daughter, [name] at bedside re: discharge planning. Patient's daughter informed CSW that she has been working on getting patient into [facility] from home. CSW confirmed with Doug/Leslie at [facility] that they would be able to take patient, pending insurance authorization. [name] at [facility] to submit for authorization this afternoon and does not anticipate that they will obtain the authorization until Monday.   Employment status:  Retired Nurse, adult PT Recommendations:  [place] / Referral to community resources:  [place]  Patient/Family's Response to care:  CSW explained to daughter that if patient is ready for discharge prior to getting insurance authorization that patient will need to go to SNF under private pay or would have to go to another SNF that would accept  Letter of Guarantee. Daughter understands.   Patient/Family's Understanding of and Emotional Response to Diagnosis, Current Treatment, and Prognosis:    Emotional Assessment Appearance:    Attitude/Demeanor/Rapport:    Affect (typically observed):    Orientation:  Oriented to Self Alcohol / Substance use:    Psych involvement (Current and /or in the community):     Discharge Needs  Concerns to be addressed:    Readmission within the last 30 days:    Current discharge risk:    Barriers to Discharge:      Standley Brooking, LCSW 09/10/2015, 3:03 PM

## 2015-09-10 NOTE — H&P (Signed)
History and Physical    Rhonda Graham ZOX:[id number] DOB: May 19, 1934 DOA: 09/09/2015  PCP: Rozanna Box, MD   Patient coming from: Home   Chief Complaint: Fever, lethargy   HPI: Rhonda Graham is an unfortunate 81 y.o. female with medical history significant for CJD with precipitous decline over the last several months, atrial fibrillation, hypothyroidism, hypertension, and hyperlipidemia who presents to the ED for evaluation of fevers and lethargy. Patient is had progressive dementia associated with CJD over the last several months, but in the past 2 weeks she has become completely nonverbal and poorly responsive to those around her. She has continued to eat and drink at times and ambulate on her own to the restroom. She was evaluated by her PCP approximately one week ago, diagnosed with UTI, and started on empiric therapy with ciprofloxacin. Couple of days later, patient's family was notified that organism isolated from the urine culture is resistant to Cipro and she was started on nitrofurantoin. She has been taking nitrofurantoin for the past 3 days but has had persistent fevers, documented 100.9 F at home this evening. She has not voiced any complaints, has not been noted to cough or vomit, and has not had diarrhea. There had been no wounds or sores identified. Patient is currently cared for by her daughter at home but they are currently working with her PCP to move her into a skilled nursing facility.   ED Course: Upon arrival to the ED, patient is found to be febrile to 38.7 F, saturating adequately on room air, tachypneic in the 30s, tachycardic in the 160s, and hypertensive in the 160/105 range. EKG demonstrates atrial fibrillation with RVR, rate 168. Chest x-ray is negative for acute cardiopulmonary disease. Chemistry panel and CBC are largely unremarkable and lactic acid is reassuring at 1.23. Urinalysis is negative for nitrite, leukocytes, or hemoglobin. There was some abdominal  tenderness on the EDP exam and CT of the abdomen and pelvis was obtained and negative for any acute pathology which would explain the fever. Blood and urine cultures were obtained in the ED. The patient was given a normal saline bolus with 30 cc/kg, Tylenol, empiric Rocephin, and placed on diltiazem infusion. Heart rate is improving with the diltiazem infusion and blood pressure has remained stable. Patient will be observed in the stepdown unit for ongoing evaluation and management of atrial fibrillation with RVR and fever with etiology not yet known.  Review of Systems:  All other systems reviewed and apart from HPI, are negative.  Past Medical History  Diagnosis Date  . Paroxysmal atrial fibrillation (HCC)   . Sinus bradycardia   . Hypothyroid   . Coronary artery disease     prior cath without blockages per pt  . Hyperlipidemia   . Hypertension   . Stroke (HCC) 09/2008    residual balance deficit  . Hypotension   . Elevated troponin     a. 08/2014 post AF ablation.  . Obesity   . Atrial flutter (HCC)     a. Multiple atypical atrial flutter circuits, too unstable for mapping/ ablation by EP study 08/21/14.  . Atrial tachycardia (HCC)     a. Atach identified on event monitoring per Dr. Jenel Lucks note.  . Hyperglycemia   . Elevated serum creatinine     a. Per labs 08/2014.  Marland Kitchen CJD (Creutzfeldt-Jakob disease)   . Diverticulitis   . Complication of anesthesia     one time low bp & difficulty waking     Past Surgical  History  Procedure Laterality Date  . Rotator cuff repair    . Ganglion cyst excision    . Knee arthroscopy    . Tee without cardioversion N/A 08/20/2014    Procedure: TRANSESOPHAGEAL ECHOCARDIOGRAM (TEE);  Surgeon: Lewayne Bunting, MD;  Location: [facility] ENDOSCOPY;  Service: Cardiovascular;  Laterality: N/A;  . Electrophysiologic study N/A 08/21/2014    Procedure: Atrial Fibrillation Ablation;  Surgeon: Hillis Range, MD;  Location: [facility] INVASIVE CV LAB;  Service: Cardiovascular;   Laterality: N/A;  . Breast cyst aspiration Right     1962 negative  . Ablation  2016     reports that she has never smoked. She has never used smokeless tobacco. She reports that she does not drink alcohol or use illicit drugs.  No Known Allergies  Family History  Problem Relation Age of Onset  . Stroke Mother   . Heart disease Mother   . Heart disease Father   . Stroke Father   . Seizures Sister   . Parkinsonism Brother   . Breast cancer Neg Hx      Prior to Admission medications   Medication Sig Start Date End Date Taking? Authorizing Provider  ALPRAZolam (XANAX) 0.5 MG tablet TK 1 T PO Q NIGHT PRF SLEEP 09/04/15  Yes Historical Provider, MD  aspirin EC 325 MG tablet Take 325 mg by mouth daily.    Yes Historical Provider, MD  diltiazem (CARDIZEM CD) 120 MG 24 hr capsule Take 120 mg by mouth daily.  08/12/15 08/11/16 Yes Historical Provider, MD  levothyroxine (SYNTHROID, LEVOTHROID) 125 MCG tablet TK 1 T PO QD 08/16/15  Yes Historical Provider, MD  Melatonin 1 MG/ML LIQD Take 1.5 mLs by mouth at bedtime.    Yes Historical Provider, MD  metoprolol (LOPRESSOR) 50 MG tablet Take 1 tablet (50 mg total) by mouth 2 (two) times daily. 07/20/15  Yes Rolly Salter, MD  metoprolol succinate (TOPROL-XL) 25 MG 24 hr tablet Take 12.5 mg by mouth daily.  08/12/15  Yes Historical Provider, MD  nitrofurantoin, macrocrystal-monohydrate, (MACROBID) 100 MG capsule Take 100 mg by mouth 2 (two) times daily.  09/06/15  Yes Historical Provider, MD  QUEtiapine (SEROQUEL) 50 MG tablet Take 50 mg by mouth every evening.   Yes Historical Provider, MD  simvastatin (ZOCOR) 20 MG tablet Take 20 mg by mouth daily at 6 PM.    Yes Historical Provider, MD  furosemide (LASIX) 20 MG tablet Take 1 tablet (20 mg total) by mouth daily. Patient not taking: Reported on 09/09/2015 07/24/15   Geoffery Lyons, MD  levothyroxine (SYNTHROID, LEVOTHROID) 100 MCG tablet Take 1 tablet (100 mcg total) by mouth daily before breakfast. Patient  not taking: Reported on 09/09/2015 07/20/15   Rolly Salter, MD  nystatin cream (MYCOSTATIN) Apply 1 application topically 2 (two) times daily. Bid to to buttock crease perianal and groin for fungal rash    Historical Provider, MD    Physical Exam: Filed Vitals:   09/09/15 2330 09/09/15 2345 09/10/15 0000 09/10/15 0015  BP:  140/90 121/56 108/59  Pulse: 75 115 101   Temp:      TempSrc:      Resp: 26 25 29    Weight:      SpO2: 98% 96% 96% 97%      Constitutional: NAD, calm, comfortable Eyes: PERTLA, lids and conjunctivae normal ENMT: Mucous membranes are moist. Posterior pharynx clear of any exudate or lesions.   Neck: normal, supple, no masses, no thyromegaly Respiratory: clear to auscultation bilaterally,  no wheezing, no crackles. Tachypneic. No accessory muscle use.  Cardiovascular: Rate ~120 and irregular. 2+ pedal pulses. No carotid bruits. No significant JVD. Abdomen: No distension, no tenderness, no masses palpated. Bowel sounds normal.  Musculoskeletal: no clubbing / cyanosis. No joint deformity upper and lower extremities. Normal muscle tone.  Skin: no significant rashes, lesions, ulcers. Warm, dry, well-perfused. Neurologic: CN 2-12 grossly intact. Nonverbal. Patellar DTRs hyperreflexive. Does not cooperate with strength testing.  Psychiatric: Non-verbal, difficult to assess.     Labs on Admission: I have personally reviewed following labs and imaging studies  CBC:  Recent Labs Lab 09/09/15 2108  WBC 10.5  NEUTROABS 7.2  HGB 12.6  HCT 38.5  MCV 86.5  PLT 269   Basic Metabolic Panel:  Recent Labs Lab 09/09/15 2108  NA 137  K 3.8  CL 101  CO2 27  GLUCOSE 115*  BUN 12  CREATININE 0.76  CALCIUM 9.1   GFR: Estimated Creatinine Clearance: 53.4 mL/min (by C-G formula based on Cr of 0.76). Liver Function Tests:  Recent Labs Lab 09/09/15 2108  AST 26  ALT 13*  ALKPHOS 70  BILITOT 0.5  PROT 7.0  ALBUMIN 3.8    Recent Labs Lab 09/09/15 2108    LIPASE 23   No results for input(s): AMMONIA in the last 168 hours. Coagulation Profile: No results for input(s): INR, PROTIME in the last 168 hours. Cardiac Enzymes: No results for input(s): CKTOTAL, CKMB, CKMBINDEX, TROPONINI in the last 168 hours. BNP (last 3 results) No results for input(s): PROBNP in the last 8760 hours. HbA1C: No results for input(s): HGBA1C in the last 72 hours. CBG: No results for input(s): GLUCAP in the last 168 hours. Lipid Profile: No results for input(s): CHOL, HDL, LDLCALC, TRIG, CHOLHDL, LDLDIRECT in the last 72 hours. Thyroid Function Tests: No results for input(s): TSH, T4TOTAL, FREET4, T3FREE, THYROIDAB in the last 72 hours. Anemia Panel: No results for input(s): VITAMINB12, FOLATE, FERRITIN, TIBC, IRON, RETICCTPCT in the last 72 hours. Urine analysis:    Component Value Date/Time   COLORURINE YELLOW 09/09/2015 2156   APPEARANCEUR CLEAR 09/09/2015 2156   LABSPEC 1.008 09/09/2015 2156   PHURINE 7.5 09/09/2015 2156   GLUCOSEU NEGATIVE 09/09/2015 2156   HGBUR NEGATIVE 09/09/2015 2156   BILIRUBINUR NEGATIVE 09/09/2015 2156   KETONESUR NEGATIVE 09/09/2015 2156   PROTEINUR NEGATIVE 09/09/2015 2156   NITRITE NEGATIVE 09/09/2015 2156   LEUKOCYTESUR NEGATIVE 09/09/2015 2156   Sepsis Labs: @LABRCNTIP (procalcitonin:4,lacticidven:4) )No results found for this or any previous visit (from the past 240 hour(s)).   Radiological Exams on Admission: Ct Abdomen Pelvis W Contrast  09/09/2015  CLINICAL DATA:  Abdominal pain and fever.  UTI. EXAM: CT ABDOMEN AND PELVIS WITH CONTRAST TECHNIQUE: Multidetector CT imaging of the abdomen and pelvis was performed using the standard protocol following bolus administration of intravenous contrast. CONTRAST:  100mL ISOVUE-300 IOPAMIDOL (ISOVUE-300) INJECTION 61% COMPARISON:  07/15/2015 FINDINGS: Lower chest and abdominal wall:  Biatrial enlargement. Mild dependent atelectasis, also seen previously Hepatobiliary: No focal  liver abnormality.No evidence of biliary obstruction or stone. Pancreas: Unremarkable. Spleen: Benign-appearing 1 cm low-density inferiorly, stable. Adrenals/Urinary Tract: Negative adrenals. No hydronephrosis or stone. No visible pyelonephritis. Unremarkable bladder. Reproductive:Hysterectomy with negative adnexae. Stomach/Bowel: No obstruction. Distal colonic diverticulosis without active inflammation. No appendicitis. Vascular/Lymphatic: No acute vascular abnormality. No mass or adenopathy. Peritoneal: No ascites or pneumoperitoneum. Musculoskeletal: Remote L1 compression fracture with moderate height loss and mild retropulsion. No acute osseous finding. IMPRESSION: 1. No acute finding or explanation for fever.  2. Chronic findings are stable from prior and described above. Electronically Signed   By: Marnee Spring M.D.   On: 09/09/2015 23:26   Dg Chest Port 1 View  09/09/2015  CLINICAL DATA:  Left rib tenderness in shortness of breath EXAM: PORTABLE CHEST 1 VIEW COMPARISON:  07/23/2015 FINDINGS: The heart size and mediastinal contours are within normal limits. Both lungs are clear. The visualized skeletal structures are unremarkable. IMPRESSION: No active disease. Electronically Signed   By: Elige Ko   On: 09/09/2015 21:31    EKG: Independently reviewed. Atrial fibrillation with RVR (rate 168)  Assessment/Plan  1. Atrial fibrillation with RVR  - RVR resolved on diltiazem infusion, will plan for conversion to PO  - CHADS-VASc is at least 102 (age x2, gender, HTN); per notes, decision was made not to anticoagulate given high fall-risk  - Managed with daily ASA 325 and Toprol, will continue - Monitor on telemetry    2. Fever  - Temp 38.7 F rectally on admission  - CXR with no apparent infiltrate and UA not suggestive of infection (though is under treatment with Macrobid at time of admission); no apparent wounds  - Blood and urine cultures incubating  - Treating with empiric Rocephin for now    - Check PCT, if low and urine cx negative, would consider stopping abx   3. CJD  - Diagnosed in December 2016 and progressing as expected  - Associated agitation has been managed with prn Ativan and Seroquel qHS, will continue   4. Hypothyroidism  - Given her presentation, will check TSH  - Continue current-dose Synthroid for now   5. Hypertension  - Elevated to 160/105 range initially, came down to goal range with diltiazem infusion  - Managed with Toprol XL at home, will continue   6. Hyperlipidemia  - Continue current management with Zocor     DVT prophylaxis: sq Lovenox  Code Status: DNR/DNI Family Communication: Daughter updated at bedside  Disposition Plan: Observe in step-down   Consults called: None   Admission status: Observation    Briscoe Deutscher, MD [facility] Pager [phone]  If 7PM-7AM, please contact night-coverage [email or web address] Password [email or web address]  09/10/2015, 12:27 AM

## 2015-09-10 NOTE — Progress Notes (Signed)
Pharmacy Antibiotic Note  Rhonda Graham is a 81 y.o. female admitted on 09/09/2015 with sepsis and UTI.  Pharmacy has been consulted for Ceftriaxone dosing.  Plan: Ceftriaxone based on manufacturer dosing Ceftriaxone 2gm iv q24hr   Weight: 154 lb 15.7 oz (70.3 kg)  Temp (24hrs), Avg:102 F (38.9 C), Min:101.7 F (38.7 C), Max:102.3 F (39.1 C)   Recent Labs Lab 09/09/15 2108 09/09/15 2123  WBC 10.5  --   CREATININE 0.76  --   LATICACIDVEN  --  1.23    Estimated Creatinine Clearance: 53.4 mL/min (by C-G formula based on Cr of 0.76).    No Known Allergies  Antimicrobials this admission: Ceftriaxone 6/8 >>  Dose adjustments this admission: -  Microbiology results: Pending  Thank you for allowing pharmacy to be a part of this patient's care.  Rhonda Graham, Rhonda Graham 09/10/2015 12:53 AM

## 2015-09-10 NOTE — Clinical Social Work Placement (Signed)
Patient has a bed at [facility], pending insurance [facility]([employer] Medicare) authorization. CSW has completed FL2 & will continue to follow and assist with discharge when ready.    Lincoln MaxinKelly Chasitee Zenker, LCSW [facility] Clinical Social Worker cell #: [phone]     CLINICAL SOCIAL WORK PLACEMENT  NOTE  Date:  09/10/2015  Patient Details  Name: Rhonda Graham MRN: [id number] Date of Birth: 12/25/1934  Clinical Social Work is seeking post-discharge placement for this patient at the Skilled  Nursing Facility level of care (*CSW will initial, date and re-position this form in  chart as items are completed):  Yes   Patient/family provided with [facility] Clinical Social Work Department's list of facilities offering this level of care within the geographic area requested by the patient (or if unable, by the patient's family).  Yes   Patient/family informed of their freedom to choose among providers that offer the needed level of care, that participate in Medicare, Medicaid or managed care program needed by the patient, have an available bed and are willing to accept the patient.  Yes   Patient/family informed of [facility] ownership interest in [facility] and [facility], as well as of the fact that they are under no obligation to receive care at these facilities.  PASRR submitted to EDS on       PASRR number received on       Existing PASRR number confirmed on 09/10/15     FL2 transmitted to all facilities in geographic area requested by pt/family on 09/10/15     FL2 transmitted to all facilities within larger geographic area on       Patient informed that his/her managed care company has contracts with or will negotiate with certain facilities, including the following:        Yes   Patient/family informed of bed offers received.  Patient chooses bed at [facility] [address]     Physician recommends and patient chooses bed at      Patient to [facility]  [facility]  on  .  Patient to be transferred to facility by       Patient family notified on   of transfer.  Name of family member notified:        PHYSICIAN       Additional Comment:    _______________________________________________ Arlyss RepressHarrison, Shahla Betsill F, LCSW 09/10/2015, 3:08 PM

## 2015-09-10 NOTE — Progress Notes (Signed)
Pt seen and examined, resting comfortably, not verbal.  Rocephin IV for presumed UTI.   Sepsis seems to have resolved.   On dilt gtt for Afib RVR, decision for no AC per family. Will transition to PO dilt today.

## 2015-09-10 NOTE — Progress Notes (Signed)
Pharmacy Brief Note  FDA warning states to limit simvastatin to 10mg /day when used with diltiazem.  Patients on diltiazem and simvastatin >10 mg/day have reported cases of rhabdomyolysis.   Atorvastatin (Lipitor) 10mg  was substituted for simvastatin 20mg  per P&T policy.    Please consider at discharge.  Thanks, Pharmacy.  Lynann Beaverhristine Renette Hsu PharmD, BCPS Pager [phone] 09/10/2015 2:43 PM

## 2015-09-10 NOTE — Evaluation (Signed)
Physical Therapy Evaluation Patient Details Name: Rhonda Graham MRN: [id number] DOB: [birth year] Today's Date: 09/10/2015   History of Present Illness  Pt admitted with UTI/sepsis and hx of CAD, CVA, CJD, and dementia.  Per notes in chart, pt is largely nonverbal   Clinical Impression  Pt admitted as above and presenting with functional mobility limitations 2* generalized weakness, balance deficits and dementia related cognitive deficits.  Pt is currently mod assist of two for all mobility tasks and would benefit from follow up rehab at SNF level.  Follow Up Recommendations SNF    Equipment Recommendations  None recommended by PT    Recommendations for Other Services       Precautions / Restrictions Precautions Precautions: Fall Restrictions Weight Bearing Restrictions: No      Mobility  Bed Mobility Overal bed mobility: +2 for physical assistance;Needs Assistance Bed Mobility: Supine to Sit     Supine to sit: +2 for physical assistance;+2 for safety/equipment;Mod assist;Max assist     General bed mobility comments: multimodal cues for sequence but with limited follow through  Transfers Overall transfer level: Needs assistance Equipment used: Rolling walker (2 wheeled) Transfers: Sit to/from Stand Sit to Stand: Mod assist;+2 physical assistance;+2 safety/equipment;From elevated surface         General transfer comment: cues for use of UEs and transition position.  Assist to bring wt up and fwd and to balance in standing  Ambulation/Gait Ambulation/Gait assistance: Mod assist;+2 safety/equipment;+2 physical assistance Ambulation Distance (Feet): 2 Feet Assistive device: Rolling walker (2 wheeled) Gait Pattern/deviations: Step-to pattern;Decreased step length - right;Decreased step length - left;Shuffle;Trunk flexed Gait velocity: decr   General Gait Details: multimodal cues for posture, position from RW .  Physical assist to manage RW, to initiate weight shift and  for support/balance  Stairs            Wheelchair Mobility    Modified Rankin (Stroke Patients Only)       Balance Overall balance assessment: Needs assistance Sitting-balance support: Feet supported;Bilateral upper extremity supported Sitting balance-Leahy Scale: Poor   Postural control: Right lateral lean;Posterior lean Standing balance support: Bilateral upper extremity supported Standing balance-Leahy Scale: Poor                               Pertinent Vitals/Pain Pain Assessment: No/denies pain    Home Living Family/patient expects to be discharged to:: Skilled nursing facility Living Arrangements: Children                    Prior Function           Comments: Unsure. Family not present     Hand Dominance        Extremity/Trunk Assessment   Upper Extremity Assessment: Difficult to assess due to impaired cognition;Generalized weakness           Lower Extremity Assessment: Difficult to assess due to impaired cognition;Generalized weakness      Cervical / Trunk Assessment: Kyphotic  Communication   Communication: Other (comment) (dementia - pt largely nonverbal)  Cognition Arousal/Alertness: Awake/alert Behavior During Therapy: Flat affect Overall Cognitive Status: History of cognitive impairments - at baseline                      General Comments      Exercises        Assessment/Plan    PT Assessment Patient needs continued PT services  PT Diagnosis  Difficulty walking   PT Problem List Decreased activity tolerance;Decreased balance;Decreased mobility;Decreased cognition;Decreased knowledge of use of DME;Decreased safety awareness  PT Treatment Interventions DME instruction;Gait training;Functional mobility training;Therapeutic activities;Therapeutic exercise;Balance training;Cognitive remediation;Patient/family education   PT Goals (Current goals can be found in the Care Plan section) Acute Rehab PT  Goals Patient Stated Goal: None stated PT Goal Formulation: Patient unable to participate in goal setting Time For Goal Achievement: 09/24/15 Potential to Achieve Goals: Fair    Frequency Min 3X/week   Barriers to discharge        Co-evaluation               End of Session Equipment Utilized During Treatment: Gait belt Activity Tolerance: Patient limited by fatigue Patient left: in chair;with call bell/phone within reach;with chair alarm set Nurse Communication: Mobility status;Other (comment) (BP 189/139)         Time: [id number] PT Time Calculation (min) (ACUTE ONLY): 23 min   Charges:   PT Evaluation $PT Eval Moderate Complexity: 1 Procedure PT Treatments $Therapeutic Activity: 8-22 mins   PT G Codes:        Aaylah Pokorny 09/10/2015, 12:58 PM

## 2015-09-10 NOTE — Progress Notes (Signed)
Initial Nutrition Assessment  DOCUMENTATION CODES:   Non-severe (moderate) malnutrition in context of acute illness/injury  INTERVENTION:  - Will order Ensure Enlive once/day, this supplement provides 350 kcal and 20 grams of protein. - Encourage PO intakes of meals and supplements. - RD will continue to monitor for additional needs.  NUTRITION DIAGNOSIS:   Inadequate oral intake related to acute illness, lethargy/confusion as evidenced by per patient/family report.  GOAL:   Patient will meet greater than or equal to 90% of their needs  MONITOR:   PO intake, Supplement acceptance, Weight trends, Labs, I & O's  REASON FOR ASSESSMENT:   Malnutrition Screening Tool  ASSESSMENT:   81 y.o. female with medical history significant for CJD with precipitous decline over the last several months, atrial fibrillation, hypothyroidism, hypertension, and hyperlipidemia who presents to the ED for evaluation of fevers and lethargy. Patient is had progressive dementia associated with CJD over the last several months, but in the past 2 weeks she has become completely nonverbal and poorly responsive to those around her. She has continued to eat and drink at times and ambulate on her own to the restroom. She was evaluated by her PCP approximately one week ago, diagnosed with UTI, and started on empiric therapy with ciprofloxacin. Couple of days later, patient's family was notified that organism isolated from the urine culture is resistant to Cipro and she was started on nitrofurantoin. She has been taking nitrofurantoin for the past 3 days but has had persistent fevers, documented 100.9 F at home this evening. She has not voiced any complaints, has not been noted to cough or vomit, and has not had diarrhea. There had been no wounds or sores identified. Patient is currently cared for by her daughter at home but they are currently working with her PCP to move her into a skilled nursing facility.   Pt seen  for MST. BMI indicates overweight status. No intakes documented since admission; no family/visitors at bedside and pt with hx of dementia, minimal interaction and is unable to provide information. Pt is able to communicate slightly through facial expressions and nodding/shaking head during visit. Pt indicates she is experiencing abdominal pain this AM. Per chart review, pt has been declining the past few months and became nonverbal ~2 weeks PTA; per notes pt continued to eat, drink, and ambulate during this time frame.  Physical assessment shows mild muscle wasting to upper body, no fat wasting. Per chart review, pt has lost 12 lbs (7.5% body weight) in the past 1.5-2 months which is significant for time frame. Will order Ensure Enlive once/day and continue to monitor for need to adjust supplements.   Unsure if pt was fully meeting needs PTA. Medications reviewed. Labsr eviewed; K: 3.4 mmol/L, Ca: 8.3 mmol/L.   Diet Order:  Diet regular Room service appropriate?: Yes; Fluid consistency:: Thin  Skin:  Reviewed, no issues  Last BM:  6/8  Height:   Ht Readings from Last 1 Encounters:  09/10/15 5\' 4"  (1.626 m)    Weight:   Wt Readings from Last 1 Encounters:  09/10/15 149 lb 11.1 oz (67.9 kg)    Ideal Body Weight:  54.54 kg (kg)  BMI:  Body mass index is 25.68 kg/(m^2).  Estimated Nutritional Needs:   Kcal:  1400-1600  Protein:  65-75 grams  Fluid:  1.6-1.8 L/day  EDUCATION NEEDS:   No education needs identified at this time     Trenton GammonJessica Laden Fieldhouse, RD, LDN Inpatient Clinical Dietitian Pager # [phone] After hours/weekend pager #  [phone]  

## 2015-09-11 DIAGNOSIS — A8100 Creutzfeldt-Jakob disease, unspecified: Secondary | ICD-10-CM | POA: Diagnosis not present

## 2015-09-11 DIAGNOSIS — R509 Fever, unspecified: Secondary | ICD-10-CM | POA: Diagnosis not present

## 2015-09-11 DIAGNOSIS — I4891 Unspecified atrial fibrillation: Secondary | ICD-10-CM | POA: Diagnosis not present

## 2015-09-11 LAB — URINE CULTURE: CULTURE: NO GROWTH

## 2015-09-11 MED ORDER — DEXTROSE 5 % IV SOLN
1.0000 g | INTRAVENOUS | Status: DC
  Administered 2015-09-11 – 2015-09-14 (×4): 1 g via INTRAVENOUS
  Filled 2015-09-11 (×4): qty 10

## 2015-09-11 MED ORDER — POTASSIUM CHLORIDE CRYS ER 20 MEQ PO TBCR
20.0000 meq | EXTENDED_RELEASE_TABLET | Freq: Once | ORAL | Status: DC
  Filled 2015-09-11: qty 1

## 2015-09-11 MED ORDER — POTASSIUM CHLORIDE 20 MEQ/15ML (10%) PO SOLN
20.0000 meq | Freq: Once | ORAL | Status: AC
  Administered 2015-09-11: 20 meq via ORAL
  Filled 2015-09-11: qty 15

## 2015-09-11 NOTE — Progress Notes (Signed)
Pharmacy Antibiotic Note  Rhonda Graham is a 81 y.o. female admitted on 09/09/2015 with sepsis and UTI.  Pharmacy has been consulted for Ceftriaxone dosing. 6/8 CT: No hydronephrosis or stone. No visible pyelonephritis. Unremarkable bladder.  Plan:  Ceftriaxone 1gm IV q24hr   Dosage remains stable and need for further dosage adjustment appears unlikely at present.  Pharmacy will sign off at this time.  Please reconsult if a change in clinical status warrants re-evaluation of dosage.   Height: 5\' 4"  (162.6 cm) Weight: 147 lb 14.9 oz (67.1 kg) IBW/kg (Calculated) : 54.7  Temp (24hrs), Avg:98.2 F (36.8 C), Min:97.3 F (36.3 C), Max:99.1 F (37.3 C)   Recent Labs Lab 09/09/15 2108 09/09/15 2123 09/10/15 0111  WBC 10.5  --   --   CREATININE 0.76  --  0.61  LATICACIDVEN  --  1.23  --     Estimated Creatinine Clearance: 53.7 mL/min (by C-G formula based on Cr of 0.61).    No Known Allergies  Antimicrobials this admission: Ceftriaxone 6/8 >>  Dose adjustments this admission:  Microbiology results: 6/8 BCx: ngtd 6/8 UCx: NGF  6/9 MRSA PCR: negative  Thank you for allowing pharmacy to be a part of this patient's care.  Lynann Beaverhristine Karn Derk PharmD, BCPS Pager [phone] 09/11/2015 12:50 PM

## 2015-09-11 NOTE — Progress Notes (Signed)
PROGRESS NOTE    Rhonda Graham  UJW:[id number] DOB: 19-Aug-1934 DOA: 09/09/2015 PCP: Rozanna Box, MD  Brief Narrative: Rhonda Graham is an unfortunate 81 y.o. female with medical history significant for CJD with precipitous decline over the last several months, atrial fibrillation, hypothyroidism, hypertension, and hyperlipidemia who presents to the ED for evaluation of fevers and lethargy. Patient is had progressive dementia associated with CJD over the last several months, but in the past 2 weeks she has become completely nonverbal and poorly responsive to those around her. She has continued to eat and drink at times and ambulate on her own to the restroom. She was evaluated by her PCP approximately one week ago, diagnosed with UTI, and started on empiric therapy with ciprofloxacin. Couple of days later, patient's family was notified that organism isolated from the urine culture is resistant to Cipro and she was started on nitrofurantoin. She has been taking nitrofurantoin for the past 3 days but has had persistent fevers, documented 100.9 F at home this evening. She has not voiced any complaints, has not been noted to cough or vomit, and has not had diarrhea. There had been no wounds or sores identified. Patient is currently cared for by her daughter at home but they are currently working with her PCP to move her into a skilled nursing facility.    Assessment & Plan:   Principal Problem:   Atrial fibrillation with rapid ventricular response (HCC) Active Problems:   Paroxysmal atrial fibrillation (HCC)   Essential hypertension   Hypothyroid   HLD (hyperlipidemia)   CJD (Creutzfeldt-Jakob disease)   Fever   Atrial fibrillation with RVR (HCC)   Malnutrition of moderate degree  1. Atrial fibrillation with RVR  - RVR resolved on diltiazem infusion, now on oral Cardizem.  - CHADS-VASc is at least 48 (age x2, gender, HTN); per notes, decision was made not to anticoagulate given high  fall-risk  - Managed with daily ASA 325 and Toprol, will continue - Monitor on telemetry   2. Fever  - Temp 38.7 F rectally on admission  - CXR with no apparent infiltrate and UA not suggestive of infection (though is under treatment with Macrobid at time of admission); no apparent wounds  - Blood culture no growth to date.  and urine cultures no growth but was on nitrofurantoin  - Continue with ceftriaxone.  3. CJD  - Diagnosed in December 2016 and progressing as expected  - Associated agitation has been managed with prn Ativan and Seroquel qHS, will continue  -difficulty with swallowing, speech evaluation ordered.   4. Hypothyroidism  - TSH low  - Continue current-dose Synthroid for now  -check free T 3, Free T 4 normal.   5. Hypertension  - Managed with Toprol XL at home, will continue   6. Hyperlipidemia  - Continue current management with simvastatin     DVT prophylaxis: Lovenox Code Status: DNR Family Communication: try to contact daughter, doesn't answer phone Disposition Plan: SNF when stable.    Consultants:   none  Procedures:   none  Antimicrobials:  Ceftriaxone 6-09   Subjective: Was only able to tell me her name.   Objective: Filed Vitals:   09/10/15 1200 09/10/15 1247 09/11/15 0500 09/11/15 1200  BP: 107/83 129/78 135/87 128/72  Pulse:  68 82 70  Temp: 97 F (36.1 C) 97.6 F (36.4 C) 99.1 F (37.3 C) 97.3 F (36.3 C)  TempSrc: Oral Axillary Axillary Oral  Resp: 16 16 16 18   Height:  Weight:   67.1 kg (147 lb 14.9 oz)   SpO2:  94% 95% 96%    Intake/Output Summary (Last 24 hours) at 09/11/15 1344 Last data filed at 09/11/15 0915  Gross per 24 hour  Intake    170 ml  Output      1 ml  Net    169 ml   Filed Weights   09/09/15 2046 09/10/15 0100 09/11/15 0500  Weight: 70.3 kg (154 lb 15.7 oz) 67.9 kg (149 lb 11.1 oz) 67.1 kg (147 lb 14.9 oz)    Examination:  General exam: Appears calm and comfortable    Respiratory system: Clear to auscultation. Respiratory effort normal. Cardiovascular system: S1 & S2 heard, RRR. No JVD, murmurs, rubs, gallops or clicks. No pedal edema. Gastrointestinal system: Abdomen is nondistended, soft and nontender. No organomegaly or masses felt. Normal bowel sounds heard. Central nervous system: Alert and oriented. No focal deficit notice.  Extremities: Symmetric 5 x 5 power. Skin: No rashes, lesions or ulcers    Data Reviewed: I have personally reviewed following labs and imaging studies  CBC:  Recent Labs Lab 09/09/15 2108  WBC 10.5  NEUTROABS 7.2  HGB 12.6  HCT 38.5  MCV 86.5  PLT 269   Basic Metabolic Panel:  Recent Labs Lab 09/09/15 2108 09/10/15 0111  NA 137 140  K 3.8 3.4*  CL 101 109  CO2 27 25  GLUCOSE 115* 122*  BUN 12 11  CREATININE 0.76 0.61  CALCIUM 9.1 8.3*   GFR: Estimated Creatinine Clearance: 53.7 mL/min (by C-G formula based on Cr of 0.61). Liver Function Tests:  Recent Labs Lab 09/09/15 2108  AST 26  ALT 13*  ALKPHOS 70  BILITOT 0.5  PROT 7.0  ALBUMIN 3.8    Recent Labs Lab 09/09/15 2108  LIPASE 23   No results for input(s): AMMONIA in the last 168 hours. Coagulation Profile:  Recent Labs Lab 09/10/15 0111  INR 1.16   Cardiac Enzymes: No results for input(s): CKTOTAL, CKMB, CKMBINDEX, TROPONINI in the last 168 hours. BNP (last 3 results) No results for input(s): PROBNP in the last 8760 hours. HbA1C: No results for input(s): HGBA1C in the last 72 hours. CBG: No results for input(s): GLUCAP in the last 168 hours. Lipid Profile: No results for input(s): CHOL, HDL, LDLCALC, TRIG, CHOLHDL, LDLDIRECT in the last 72 hours. Thyroid Function Tests:  Recent Labs  09/10/15 0111  TSH 0.297*  FREET4 0.96   Anemia Panel: No results for input(s): VITAMINB12, FOLATE, FERRITIN, TIBC, IRON, RETICCTPCT in the last 72 hours. Sepsis Labs:  Recent Labs Lab 09/09/15 2123 09/10/15 0111  PROCALCITON  --   <0.10  LATICACIDVEN 1.23  --     Recent Results (from the past 240 hour(s))  Blood Culture (routine x 2)     Status: None (Preliminary result)   Collection Time: 09/09/15  9:07 PM  Result Value Ref Range Status   Specimen Description BLOOD RIGHT HAND  Final   Special Requests BOTTLES DRAWN AEROBIC AND ANAEROBIC 3ML  Final   Culture   Final    NO GROWTH < 24 HOURS [facility]    Report Status PENDING  Incomplete  Urine culture     Status: None   Collection Time: 09/09/15  9:56 PM  Result Value Ref Range Status   Specimen Description URINE, CATHETERIZED  Final   Special Requests NONE  Final   Culture NO GROWTH [facility]   Final  Report Status 09/11/2015 FINAL  Final  MRSA PCR Screening     Status: None   Collection Time: 09/10/15 12:59 AM  Result Value Ref Range Status   MRSA by PCR NEGATIVE NEGATIVE Final    Comment:        The GeneXpert MRSA Assay (FDA approved for NASAL specimens only), is one component of a comprehensive MRSA colonization surveillance program. It is not intended to diagnose MRSA infection nor to guide or monitor treatment for MRSA infections.          Radiology Studies: Ct Abdomen Pelvis W Contrast  09/09/2015  CLINICAL DATA:  Abdominal pain and fever.  UTI. EXAM: CT ABDOMEN AND PELVIS WITH CONTRAST TECHNIQUE: Multidetector CT imaging of the abdomen and pelvis was performed using the standard protocol following bolus administration of intravenous contrast. CONTRAST:  ISOVUE-300 IOPAMIDOL (ISOVUE-300) INJECTION 61% COMPARISON:  07/15/2015 FINDINGS: Lower chest and abdominal wall:  Biatrial enlargement. Mild dependent atelectasis, also seen previously Hepatobiliary: No focal liver abnormality.No evidence of biliary obstruction or stone. Pancreas: Unremarkable. Spleen: Benign-appearing 1 cm low-density inferiorly, stable. Adrenals/Urinary Tract: Negative adrenals. No hydronephrosis or stone. No visible  pyelonephritis. Unremarkable bladder. Reproductive:Hysterectomy with negative adnexae. Stomach/Bowel: No obstruction. Distal colonic diverticulosis without active inflammation. No appendicitis. Vascular/Lymphatic: No acute vascular abnormality. No mass or adenopathy. Peritoneal: No ascites or pneumoperitoneum. Musculoskeletal: Remote L1 compression fracture with moderate height loss and mild retropulsion. No acute osseous finding. IMPRESSION: 1. No acute finding or explanation for fever. 2. Chronic findings are stable from prior and described above. Electronically Signed   By: Marnee Spring M.D.   On: 09/09/2015 23:26   Dg Chest Port 1 View  09/09/2015  CLINICAL DATA:  Left rib tenderness in shortness of breath EXAM: PORTABLE CHEST 1 VIEW COMPARISON:  07/23/2015 FINDINGS: The heart size and mediastinal contours are within normal limits. Both lungs are clear. The visualized skeletal structures are unremarkable. IMPRESSION: No active disease. Electronically Signed   By: Elige Ko   On: 09/09/2015 21:31        Scheduled Meds: . aspirin EC  325 mg Oral Daily  . atorvastatin  10 mg Oral q1800  . cefTRIAXone (ROCEPHIN)  IV  1 g Intravenous Q24H  . diltiazem  30 mg Oral Q6H  . enoxaparin (LOVENOX) injection  40 mg Subcutaneous Q24H  . feeding supplement (ENSURE ENLIVE)  237 mL Oral Q24H  . levothyroxine  125 mcg Oral QAC breakfast  . metoprolol  50 mg Oral BID  . QUEtiapine  50 mg Oral QPM   Continuous Infusions:       Time spent: 35 minutes.     Alba Cory, MD [facility] Pager [phone]  If 7PM-7AM, please contact night-coverage [email or web address] Password [email or web address] 09/11/2015, 1:44 PM

## 2015-09-11 NOTE — Evaluation (Signed)
Clinical/Bedside Swallow Evaluation Patient Details  Name: MEKISHA BITTEL MRN: [id number] Date of Birth: [birth year]  Today's Date: 09/11/2015 Time: SLP Start Time (ACUTE ONLY): 1405 SLP Stop Time (ACUTE ONLY): 1430 SLP Time Calculation (min) (ACUTE ONLY): 25 min  Past Medical History:  Past Medical History  Diagnosis Date  . Paroxysmal atrial fibrillation (HCC)   . Sinus bradycardia   . Hypothyroid   . Coronary artery disease     prior cath without blockages per pt  . Hyperlipidemia   . Hypertension   . Stroke (HCC) 09/2008    residual balance deficit  . Hypotension   . Elevated troponin     a. 08/2014 post AF ablation.  . Obesity   . Atrial flutter (HCC)     a. Multiple atypical atrial flutter circuits, too unstable for mapping/ ablation by EP study 08/21/14.  . Atrial tachycardia (HCC)     a. Atach identified on event monitoring per Dr. Jenel Lucks note.  . Hyperglycemia   . Elevated serum creatinine     a. Per labs 08/2014.  Marland Kitchen CJD (Creutzfeldt-Jakob disease)   . Diverticulitis   . Complication of anesthesia     one time low bp & difficulty waking    Past Surgical History:  Past Surgical History  Procedure Laterality Date  . Rotator cuff repair    . Ganglion cyst excision    . Knee arthroscopy    . Tee without cardioversion N/A 08/20/2014    Procedure: TRANSESOPHAGEAL ECHOCARDIOGRAM (TEE);  Surgeon: Lewayne Bunting, MD;  Location: [facility] ENDOSCOPY;  Service: Cardiovascular;  Laterality: N/A;  . Electrophysiologic study N/A 08/21/2014    Procedure: Atrial Fibrillation Ablation;  Surgeon: Hillis Range, MD;  Location: [facility] INVASIVE CV LAB;  Service: Cardiovascular;  Laterality: N/A;  . Breast cyst aspiration Right     1962 negative  . Ablation  2016   HPI:  Patient is a 81 y.o. female with h/o: CJD, HTN, progressive dementia-has become largely non-verbal over last 2-weeks, admitted for evaluation of fevers and lethargy.    Assessment / Plan / Recommendation Clinical Impression  Patient presents with a mild oropharyneal dysphagia with impact from cognitive impairment. She did not exhibit any overt s/s of aspiration during this evaluation, but she did exhibit increasing difficulty and prolonged mastication and oral manipulation with hard solids.     Aspiration Risk  Mild aspiration risk    Diet Recommendation Thin liquid;Dysphagia 3 (Mech soft)   Liquid Administration via: Cup;Straw Medication Administration: Whole meds with puree Supervision: Staff to assist with self feeding;Full supervision/cueing for compensatory strategies Compensations: Minimize environmental distractions;Slow rate;Follow solids with liquid Postural Changes: Seated upright at 90 degrees    Other  Recommendations Oral Care Recommendations: Oral care BID   Follow up Recommendations  Skilled Nursing facility;24 hour supervision/assistance    Frequency and Duration min 1 x/week  1 week       Prognosis Prognosis for Safe Diet Advancement: Good Barriers to Reach Goals: Cognitive deficits      Swallow Study   General Date of Onset: 09/09/15 HPI: Patient is a 81 y.o. female with h/o: CJD, HTN, progressive dementia-has become largely non-verbal over last 2-weeks, admitted for evaluation of fevers and lethargy.  Type of Study: Bedside Swallow Evaluation Previous Swallow Assessment: N/A Diet Prior to this Study: Regular;Thin liquids Temperature Spikes Noted: No Respiratory Status: Room air History of Recent Intubation: No Behavior/Cognition: Alert;Pleasant mood;Confused;Requires cueing Oral Care Completed by SLP: No Oral Cavity - Dentition: Adequate natural dentition Self-Feeding  Abilities: Needs assist Patient Positioning: Upright in bed Baseline Vocal Quality: Normal Volitional Cough: Cognitively unable to elicit Volitional Swallow: Unable to elicit    Oral/Motor/Sensory Function Overall Oral Motor/Sensory Function: Within functional limits   Ice Chips     Thin Liquid Thin Liquid:  Within functional limits Presentation: Cup;Straw    Nectar Thick     Honey Thick     Puree Puree: Impaired Oral Phase Impairments: Other (comment) (exhibited some chewing of purees prior to swallowing)   Solid   GO   Solid: Impaired Oral Phase Impairments: Impaired mastication;Poor awareness of bolus Pharyngeal Phase Impairments: Suspected delayed Swallow    Functional Assessment Tool Used: clinician judgement, Bedside Swallow Evaluation Functional Limitations: Swallowing Swallow Current Status ([id number]): At least 20 percent but less than 40 percent impaired, limited or restricted Swallow Goal Status [phone]): At least 1 percent but less than 20 percent impaired, limited or restricted   Elio ForgetPreston, Britta Louth Tarrell 09/11/2015,5:00 PM    Angela NevinJohn T. Genova Kiner, MA, CCC-SLP 09/11/2015 5:00 PM

## 2015-09-12 DIAGNOSIS — Z820 Family history of epilepsy and other diseases of the nervous system: Secondary | ICD-10-CM | POA: Diagnosis not present

## 2015-09-12 DIAGNOSIS — R509 Fever, unspecified: Secondary | ICD-10-CM | POA: Diagnosis present

## 2015-09-12 DIAGNOSIS — F0280 Dementia in other diseases classified elsewhere without behavioral disturbance: Secondary | ICD-10-CM | POA: Diagnosis present

## 2015-09-12 DIAGNOSIS — I480 Paroxysmal atrial fibrillation: Secondary | ICD-10-CM | POA: Diagnosis present

## 2015-09-12 DIAGNOSIS — I4891 Unspecified atrial fibrillation: Secondary | ICD-10-CM | POA: Diagnosis not present

## 2015-09-12 DIAGNOSIS — N390 Urinary tract infection, site not specified: Secondary | ICD-10-CM | POA: Diagnosis present

## 2015-09-12 DIAGNOSIS — R000 Tachycardia, unspecified: Secondary | ICD-10-CM | POA: Diagnosis present

## 2015-09-12 DIAGNOSIS — Z8249 Family history of ischemic heart disease and other diseases of the circulatory system: Secondary | ICD-10-CM | POA: Diagnosis not present

## 2015-09-12 DIAGNOSIS — R0682 Tachypnea, not elsewhere classified: Secondary | ICD-10-CM | POA: Diagnosis present

## 2015-09-12 DIAGNOSIS — Z7982 Long term (current) use of aspirin: Secondary | ICD-10-CM | POA: Diagnosis not present

## 2015-09-12 DIAGNOSIS — E440 Moderate protein-calorie malnutrition: Secondary | ICD-10-CM | POA: Diagnosis present

## 2015-09-12 DIAGNOSIS — Z6825 Body mass index (BMI) 25.0-25.9, adult: Secondary | ICD-10-CM | POA: Diagnosis not present

## 2015-09-12 DIAGNOSIS — A8100 Creutzfeldt-Jakob disease, unspecified: Secondary | ICD-10-CM | POA: Diagnosis present

## 2015-09-12 DIAGNOSIS — Z7189 Other specified counseling: Secondary | ICD-10-CM | POA: Diagnosis not present

## 2015-09-12 DIAGNOSIS — Z823 Family history of stroke: Secondary | ICD-10-CM | POA: Diagnosis not present

## 2015-09-12 DIAGNOSIS — I2510 Atherosclerotic heart disease of native coronary artery without angina pectoris: Secondary | ICD-10-CM | POA: Diagnosis present

## 2015-09-12 DIAGNOSIS — Z66 Do not resuscitate: Secondary | ICD-10-CM | POA: Diagnosis present

## 2015-09-12 DIAGNOSIS — Z1623 Resistance to quinolones and fluoroquinolones: Secondary | ICD-10-CM | POA: Diagnosis present

## 2015-09-12 DIAGNOSIS — E785 Hyperlipidemia, unspecified: Secondary | ICD-10-CM | POA: Diagnosis present

## 2015-09-12 DIAGNOSIS — Z79899 Other long term (current) drug therapy: Secondary | ICD-10-CM | POA: Diagnosis not present

## 2015-09-12 DIAGNOSIS — Z515 Encounter for palliative care: Secondary | ICD-10-CM | POA: Diagnosis not present

## 2015-09-12 DIAGNOSIS — Z8673 Personal history of transient ischemic attack (TIA), and cerebral infarction without residual deficits: Secondary | ICD-10-CM | POA: Diagnosis not present

## 2015-09-12 DIAGNOSIS — I10 Essential (primary) hypertension: Secondary | ICD-10-CM | POA: Diagnosis present

## 2015-09-12 DIAGNOSIS — E039 Hypothyroidism, unspecified: Secondary | ICD-10-CM | POA: Diagnosis present

## 2015-09-12 LAB — BASIC METABOLIC PANEL
ANION GAP: 10 (ref 5–15)
BUN: 12 mg/dL (ref 6–20)
CHLORIDE: 103 mmol/L (ref 101–111)
CO2: 25 mmol/L (ref 22–32)
CREATININE: 0.73 mg/dL (ref 0.44–1.00)
Calcium: 9 mg/dL (ref 8.9–10.3)
GFR calc non Af Amer: >60 mL/min (ref >60)
Glucose, Bld: 115 mg/dL — ABNORMAL HIGH (ref 65–99)
POTASSIUM: 3.9 mmol/L (ref 3.5–5.1)
SODIUM: 138 mmol/L (ref 135–145)

## 2015-09-12 NOTE — Progress Notes (Signed)
PROGRESS NOTE    Rhonda Graham  HQI:[id number] DOB: 10/03/1934 DOA: 09/09/2015 PCP: [name], [name], MD  Brief Narrative: Rhonda JourneyRuth C Graham is an unfortunate 81 y.o. female with medical history significant for CJD with precipitous decline over the last several months, atrial fibrillation, hypothyroidism, hypertension, and hyperlipidemia who presents to the ED for evaluation of fevers and lethargy. Patient is had progressive dementia associated with CJD over the last several months, but in the past 2 weeks she has become completely nonverbal and poorly responsive to those around her. She has continued to eat and drink at times and ambulate on her own to the restroom. She was evaluated by her PCP approximately one week ago, diagnosed with UTI, and started on empiric therapy with ciprofloxacin. Couple of days later, patient's family was notified that organism isolated from the urine culture is resistant to Cipro and she was started on nitrofurantoin. She has been taking nitrofurantoin for the past 3 days but has had persistent fevers, documented 100.9 F at home this evening. She has not voiced any complaints, has not been noted to cough or vomit, and has not had diarrhea. There had been no wounds or sores identified. Patient is currently cared for by her daughter at home but they are currently working with her PCP to move her into a skilled nursing facility.    Assessment & Plan:   Principal Problem:   Atrial fibrillation with rapid ventricular response (HCC) Active Problems:   Paroxysmal atrial fibrillation (HCC)   Essential hypertension   Hypothyroid   HLD (hyperlipidemia)   CJD (Creutzfeldt-Jakob disease)   Fever   Atrial fibrillation with RVR (HCC)   Malnutrition of moderate degree  1. Atrial fibrillation with RVR  - RVR resolved on diltiazem infusion, now on oral Cardizem.  - CHADS-VASc is at least 114 (age x2, gender, HTN); per notes, decision was made not to anticoagulate given high  fall-risk , advised daughter to follow up with primary cardiologist  - Managed with daily ASA 325 and Toprol, will continue - Monitor on telemetry   2. Fever  - Temp 38.7 F rectally on admission  - CXR with no apparent infiltrate and UA not suggestive of infection (though is under treatment with Macrobid at time of admission); no apparent wounds  - Blood culture no growth to date.  and urine cultures no growth but was on nitrofurantoin  - Continue with ceftriaxone. Day 2.   3. CJD  - Diagnosed in December 2016 and progressing as expected  - Associated agitation has been managed with prn Ativan and Seroquel qHS, will continue  -on dysphagia 3 diet   4. Hypothyroidism  - TSH low  - Continue current-dose Synthroid for now  - free T 3 pending , Free T 4 normal.   5. Hypertension  - Managed with Toprol XL at home, will continue   6. Hyperlipidemia  - Continue current management with simvastatin     DVT prophylaxis: Lovenox Code Status: DNR Family Communication: daughter updated 6-11 Disposition Plan: SNF when stable.    Consultants:   none  Procedures:   none  Antimicrobials:  Ceftriaxone 6-09   Subjective: Not very conversant. Was only able to tell me her name   Objective: Filed Vitals:   09/11/15 2019 09/11/15 2124 09/12/15 0500 09/12/15 1033  BP: 143/68  137/77 140/60  Pulse: 85  86 76  Temp: 99.9 F (37.7 C) 99 F (37.2 C) 99.2 F (37.3 C) 99.6 F (37.6 C)  TempSrc: Axillary  Axillary Axillary Axillary  Resp: 18  18 20   Height:      Weight:   66.9 kg (147 lb 7.8 oz)   SpO2: 100%  99% 99%    Intake/Output Summary (Last 24 hours) at 09/12/15 1051 Last data filed at 09/11/15 2300  Gross per 24 hour  Intake    480 ml  Output      3 ml  Net    477 ml   Filed Weights   09/10/15 0100 09/11/15 0500 09/12/15 0500  Weight: 67.9 kg (149 lb 11.1 oz) 67.1 kg (147 lb 14.9 oz) 66.9 kg (147 lb 7.8 oz)    Examination:  General exam: Appears  calm and comfortable  Respiratory system: Clear to auscultation. Respiratory effort normal. Cardiovascular system: S1 & S2 heard, RRR. No JVD, murmurs, rubs, gallops or clicks. No pedal edema. Gastrointestinal system: Abdomen is nondistended, soft and nontender. No organomegaly or masses felt. Normal bowel sounds heard. Central nervous system: Alert . No focal deficit notice.  Extremities: Symmetric 5 x 5 power. Skin: No rashes, lesions or ulcers    Data Reviewed: I have personally reviewed following labs and imaging studies  CBC:  Recent Labs Lab 09/09/15 2108  WBC 10.5  NEUTROABS 7.2  HGB 12.6  HCT 38.5  MCV 86.5  PLT 269   Basic Metabolic Panel:  Recent Labs Lab 09/09/15 2108 09/10/15 0111  NA 137 140  K 3.8 3.4*  CL 101 109  CO2 27 25  GLUCOSE 115* 122*  BUN 12 11  CREATININE 0.76 0.61  CALCIUM 9.1 8.3*   GFR: Estimated Creatinine Clearance: 53.7 mL/min (by C-G formula based on Cr of 0.61). Liver Function Tests:  Recent Labs Lab 09/09/15 2108  AST 26  ALT 13*  ALKPHOS 70  BILITOT 0.5  PROT 7.0  ALBUMIN 3.8    Recent Labs Lab 09/09/15 2108  LIPASE 23   No results for input(s): AMMONIA in the last 168 hours. Coagulation Profile:  Recent Labs Lab 09/10/15 0111  INR 1.16   Cardiac Enzymes: No results for input(s): CKTOTAL, CKMB, CKMBINDEX, TROPONINI in the last 168 hours. BNP (last 3 results) No results for input(s): PROBNP in the last 8760 hours. HbA1C: No results for input(s): HGBA1C in the last 72 hours. CBG: No results for input(s): GLUCAP in the last 168 hours. Lipid Profile: No results for input(s): CHOL, HDL, LDLCALC, TRIG, CHOLHDL, LDLDIRECT in the last 72 hours. Thyroid Function Tests:  Recent Labs  09/10/15 0111  TSH 0.297*  FREET4 0.96   Anemia Panel: No results for input(s): VITAMINB12, FOLATE, FERRITIN, TIBC, IRON, RETICCTPCT in the last 72 hours. Sepsis Labs:  Recent Labs Lab 09/09/15 2123 09/10/15 0111    PROCALCITON  --  <0.10  LATICACIDVEN 1.23  --     Recent Results (from the past 240 hour(s))  Blood Culture (routine x 2)     Status: None (Preliminary result)   Collection Time: 09/09/15  9:07 PM  Result Value Ref Range Status   Specimen Description BLOOD RIGHT HAND  Final   Special Requests BOTTLES DRAWN AEROBIC AND ANAEROBIC  Final   Culture   Final    NO GROWTH 2 DAYS Performed at [facility]    Report Status PENDING  Incomplete  Urine culture     Status: None   Collection Time: 09/09/15  9:56 PM  Result Value Ref Range Status   Specimen Description URINE, CATHETERIZED  Final   Special Requests NONE  Final  Culture NO GROWTH Performed at [facility]   Final   Report Status 09/11/2015 FINAL  Final  MRSA PCR Screening     Status: None   Collection Time: 09/10/15 12:59 AM  Result Value Ref Range Status   MRSA by PCR NEGATIVE NEGATIVE Final    Comment:        The GeneXpert MRSA Assay (FDA approved for NASAL specimens only), is one component of a comprehensive MRSA colonization surveillance program. It is not intended to diagnose MRSA infection nor to guide or monitor treatment for MRSA infections.          Radiology Studies: No results found.      Scheduled Meds: . aspirin EC  325 mg Oral Daily  . atorvastatin  10 mg Oral q1800  . cefTRIAXone (ROCEPHIN)  IV  1 g Intravenous Q24H  . diltiazem  30 mg Oral Q6H  . enoxaparin (LOVENOX) injection  40 mg Subcutaneous Q24H  . feeding supplement (ENSURE ENLIVE)  237 mL Oral Q24H  . levothyroxine  125 mcg Oral QAC breakfast  . metoprolol  50 mg Oral BID  . QUEtiapine  50 mg Oral QPM   Continuous Infusions:       Time spent: 35 minutes.     Alba Cory, MD [facility] Pager [phone]  If 7PM-7AM, please contact night-coverage [email or web address] Password [facility] 09/12/2015, 10:51 AM

## 2015-09-13 DIAGNOSIS — Z515 Encounter for palliative care: Secondary | ICD-10-CM | POA: Insufficient documentation

## 2015-09-13 DIAGNOSIS — Z7189 Other specified counseling: Secondary | ICD-10-CM | POA: Insufficient documentation

## 2015-09-13 LAB — BASIC METABOLIC PANEL
Anion gap: 6 (ref 5–15)
BUN: 17 mg/dL (ref 6–20)
CALCIUM: 9.1 mg/dL (ref 8.9–10.3)
CO2: 28 mmol/L (ref 22–32)
CREATININE: 0.8 mg/dL (ref 0.44–1.00)
Chloride: 105 mmol/L (ref 101–111)
Glucose, Bld: 122 mg/dL — ABNORMAL HIGH (ref 65–99)
Potassium: 3.7 mmol/L (ref 3.5–5.1)
SODIUM: 139 mmol/L (ref 135–145)

## 2015-09-13 MED ORDER — ENSURE ENLIVE PO LIQD
237.0000 mL | ORAL | Status: AC
Start: 2015-09-13 — End: ?

## 2015-09-13 MED ORDER — ALPRAZOLAM 0.5 MG PO TABS: 0.5000 mg | ORAL_TABLET | Freq: Three times a day (TID) | ORAL | Status: AC | PRN

## 2015-09-13 MED ORDER — ATORVASTATIN CALCIUM 10 MG PO TABS: 10.0000 mg | ORAL_TABLET | Freq: Every day | ORAL | Status: AC

## 2015-09-13 MED ORDER — ASPIRIN 81 MG PO CHEW
81.0000 mg | CHEWABLE_TABLET | Freq: Every day | ORAL | Status: DC
  Administered 2015-09-13 – 2015-09-14 (×2): 81 mg via ORAL
  Filled 2015-09-13 (×2): qty 1

## 2015-09-13 MED ORDER — LEVOTHYROXINE SODIUM 125 MCG PO TABS: 125.0000 ug | ORAL_TABLET | Freq: Every day | ORAL | Status: AC

## 2015-09-13 MED ORDER — DILTIAZEM HCL 30 MG PO TABS: 30.0000 mg | ORAL_TABLET | Freq: Four times a day (QID) | ORAL | Status: AC

## 2015-09-13 NOTE — Progress Notes (Signed)
Physical Therapy Treatment Patient Details Name: Rhonda Graham MRN: [id number] DOB: 09/22/1934 Today's Date: 09/13/2015    History of Present Illness Pt admitted with UTI/sepsis and hx of CAD, CVA, CJD, and dementia.  Per notes in chart, pt is largely nonverbal     PT Comments    Pt in bed aroused to name eye contact only.  Non verbal.  Assisted to sitting EOB + 2 assist then Mod Assist to maintain  balance.  "Bear Hug" 1/4 pivot trun to recliner as pt was unable to rise and unable to support self in standing.    Follow Up Recommendations  SNF     Equipment Recommendations       Recommendations for Other Services       Precautions / Restrictions Precautions Precautions: Fall Precaution Comments: Hx dementia Restrictions Weight Bearing Restrictions: No    Mobility  Bed Mobility Overal bed mobility: +2 for physical assistance;Needs Assistance Bed Mobility: Supine to Sit     Supine to sit: +2 for physical assistance;+2 for safety/equipment;Total assist     General bed mobility comments: no initiation, eye contact only,  R hand gripping to gown  Transfers Overall transfer level: Needs assistance Equipment used: Rolling walker (2 wheeled) Transfers: Stand Pivot Transfers           General transfer comment: "Bear Hug" 1/4 pivot turn from elevated bed to recliner.  Pt was unable to support self and unable to functionally turn.    Ambulation/Gait             General Gait Details: unable to attempt due to low transfer ability   Stairs            Wheelchair Mobility    Modified Rankin (Stroke Patients Only)       Balance                                    Cognition Arousal/Alertness: Awake/alert Behavior During Therapy: Flat affect Overall Cognitive Status: No family/caregiver present to determine baseline cognitive functioning                      Exercises      General Comments        Pertinent Vitals/Pain Pain  Assessment: No/denies pain    Home Living                      Prior Function            PT Goals (current goals can now be found in the care plan section) Progress towards PT goals: Progressing toward goals    Frequency  Min 3X/week    PT Plan Current plan remains appropriate    Co-evaluation             End of Session Equipment Utilized During Treatment: Gait belt Activity Tolerance: Patient tolerated treatment well Patient left: in chair;with call bell/phone within reach;with chair alarm set     Time: 1002-1027 PT Time Calculation (min) (ACUTE ONLY): 25 min  Charges:  $Therapeutic Activity: 23-37 mins                    G Codes:      Felecia ShellingLori Jadyn Brasher  PTA [facility]  Acute  Rehab Pager      [phone]

## 2015-09-13 NOTE — Discharge Summary (Signed)
Physician Discharge Summary  Rhonda Graham NFA:[id number] DOB: [birth year] DOA: 09/09/2015  PCP: Rhonda Box, MD  Admit date: 09/09/2015 Discharge date: 09/13/2015  Admitted From: Home Disposition: SNF  Recommendations for Outpatient Follow-up:  1. Follow up with PCP in 1-2 weeks 2. Please obtain BMP/CBC in one week 3. Please follow up on the following pending results: Free T 3 adjust synthroid as needed.  4. Needs to follow up with cardiology for further recommendation anticoagulation.  5. Needs Palliative care consult at facility     Discharge Condition: Stable.  CODE STATUS: DNR Diet recommendation:  Dysphagia 3 diet   Brief/Interim Summary: Brief Narrative: Rhonda Graham is an unfortunate 81 y.o. female with medical history significant for CJD with precipitous decline over the last several months, atrial fibrillation, hypothyroidism, hypertension, and hyperlipidemia who presents to the ED for evaluation of fevers and lethargy. Patient is had progressive dementia associated with CJD over the last several months, but in the past 2 weeks she has become completely nonverbal and poorly responsive to those around her. She has continued to eat and drink at times and ambulate on her own to the restroom. She was evaluated by her PCP approximately one week ago, diagnosed with UTI, and started on empiric therapy with ciprofloxacin. Couple of days later, patient's family was notified that organism isolated from the urine culture is resistant to Cipro and she was started on nitrofurantoin. She has been taking nitrofurantoin for the past 3 days but has had persistent fevers, documented 100.9 F at home this evening. She has not voiced any complaints, has not been noted to cough or vomit, and has not had diarrhea. There had been no wounds or sores identified. Patient is currently cared for by her daughter at home but they are currently working with her PCP to move her into a skilled nursing facility.     Assessment & Plan:  Principal Problem:  Atrial fibrillation with rapid ventricular response (HCC) Active Problems:  Paroxysmal atrial fibrillation (HCC)  Essential hypertension  Hypothyroid  HLD (hyperlipidemia)  CJD (Creutzfeldt-Jakob disease)  Fever  Atrial fibrillation with RVR (HCC)  Malnutrition of moderate degree  1. Atrial fibrillation with RVR  - RVR resolved on diltiazem infusion, now on oral Cardizem.  - CHADS-VASc is at least 45 (age x2, gender, HTN); per notes, decision was made not to anticoagulate given high fall-risk , advised daughter to follow up with primary cardiologist  - Managed with daily ASA 325 and Toprol, will continue - Monitor on telemetry   2. Fever  - Temp 38.7 F rectally on admission  - CXR with no apparent infiltrate and UA not suggestive of infection (though is under treatment with Macrobid at time of admission); no apparent wounds  - Blood culture no growth to date. and urine cultures no growth but was on nitrofurantoin  - received ceftriaxone for 3 days. Finished treatment for UTI. Repeated urine culture no growth to date.   3. CJD  - Diagnosed in December 2016 and progressing as expected  - Associated agitation has been managed with prn Ativan and Seroquel qHS, will continue  -on dysphagia 3 diet  -needs palliative care consult   4. Hypothyroidism  - TSH low  - Continue current-dose Synthroid for now  - free T 3 pending , Free T 4 normal.   5. Hypertension  - Managed with Toprol XL at home, will continue   6. Hyperlipidemia  - Continue current management with lipitor. Avoid simvastatin , patient on  Cardizem.    Discharge Diagnoses:    Atrial fibrillation with rapid ventricular response (HCC)   UTI   Paroxysmal atrial fibrillation (HCC)   Essential hypertension   Hypothyroid   HLD (hyperlipidemia)   CJD (Creutzfeldt-Jakob disease)   Fever   Atrial fibrillation with RVR (HCC)   Malnutrition of  moderate degree    Discharge Instructions  Discharge Instructions    Diet - low sodium heart healthy    Complete by:  As directed      Increase activity slowly    Complete by:  As directed             Medication List    STOP taking these medications        diltiazem 120 MG 24 hr capsule  Commonly known as:  CARDIZEM CD     furosemide 20 MG tablet  Commonly known as:  LASIX     metoprolol succinate 25 MG 24 hr tablet  Commonly known as:  TOPROL-XL     nitrofurantoin (macrocrystal-monohydrate) 100 MG capsule  Commonly known as:  MACROBID     simvastatin 20 MG tablet  Commonly known as:  ZOCOR      TAKE these medications        ALPRAZolam 0.5 MG tablet  Commonly known as:  XANAX  Take 1 tablet (0.5 mg total) by mouth 3 (three) times daily as needed for anxiety or sleep.     aspirin EC 325 MG tablet  Take 325 mg by mouth daily.     atorvastatin 10 MG tablet  Commonly known as:  LIPITOR  Take 1 tablet (10 mg total) by mouth daily at 6 PM.     diltiazem 30 MG tablet  Commonly known as:  CARDIZEM  Take 1 tablet (30 mg total) by mouth every 6 (six) hours.     feeding supplement (ENSURE ENLIVE) Liqd  Take 237 mLs by mouth daily.     levothyroxine 125 MCG tablet  Commonly known as:  SYNTHROID, LEVOTHROID  Take 1 tablet (125 mcg total) by mouth daily before breakfast.     Melatonin 1 MG/ML Liqd  Take 1.5 mLs by mouth at bedtime.     metoprolol 50 MG tablet  Commonly known as:  LOPRESSOR  Take 1 tablet (50 mg total) by mouth 2 (two) times daily.     nystatin cream  Commonly known as:  MYCOSTATIN  Apply 1 application topically 2 (two) times daily. Bid to to buttock crease perianal and groin for fungal rash     QUEtiapine 50 MG tablet  Commonly known as:  SEROQUEL  Take 50 mg by mouth every evening.           Follow-up Information    Follow up with BABAOFF, [name], MD In 1 week.   Specialty:  Family Medicine   Contact information:   [address] [facility] [place] [address] [phone]       Follow up with Rhonda Range, MD In 1 week.   Specialty:  Cardiology   Contact information:   [address] [place] [address] [phone]      No Known Allergies  Consultations:  none   Procedures/Studies: Ct Abdomen Pelvis W Contrast  09/09/2015  CLINICAL DATA:  Abdominal pain and fever.  UTI. EXAM: CT ABDOMEN AND PELVIS WITH CONTRAST TECHNIQUE: Multidetector CT imaging of the abdomen and pelvis was performed using the standard protocol following bolus  administration of intravenous contrast. CONTRAST:  ISOVUE-300 IOPAMIDOL (ISOVUE-300) INJECTION 61% COMPARISON:  07/15/2015 FINDINGS: Lower chest and abdominal wall:  Biatrial enlargement. Mild dependent atelectasis, also seen previously Hepatobiliary: No focal liver abnormality.No evidence of biliary obstruction or stone. Pancreas: Unremarkable. Spleen: Benign-appearing 1 cm low-density inferiorly, stable. Adrenals/Urinary Tract: Negative adrenals. No hydronephrosis or stone. No visible pyelonephritis. Unremarkable bladder. Reproductive:Hysterectomy with negative adnexae. Stomach/Bowel: No obstruction. Distal colonic diverticulosis without active inflammation. No appendicitis. Vascular/Lymphatic: No acute vascular abnormality. No mass or adenopathy. Peritoneal: No ascites or pneumoperitoneum. Musculoskeletal: Remote L1 compression fracture with moderate height loss and mild retropulsion. No acute osseous finding. IMPRESSION: 1. No acute finding or explanation for fever. 2. Chronic findings are stable from prior and described above. Electronically Signed   By: Marnee Spring M.D.   On: 09/09/2015 23:26   Dg Chest Port 1 View  09/09/2015  CLINICAL DATA:  Left rib tenderness in shortness of breath EXAM: PORTABLE CHEST 1 VIEW COMPARISON:  07/23/2015 FINDINGS: The heart size and mediastinal contours are within normal limits.  Both lungs are clear. The visualized skeletal structures are unremarkable. IMPRESSION: No active disease. Electronically Signed   By: Elige Ko   On: 09/09/2015 21:31       Subjective: Denies pain, say few words.   Discharge Exam: Filed Vitals:   09/12/15 2114 09/13/15 0447  BP: 132/63 103/66  Pulse: 57 68  Temp: 99.6 F (37.6 C) 97.7 F (36.5 C)  Resp: 18 16   Filed Vitals:   09/12/15 1200 09/12/15 1716 09/12/15 2114 09/13/15 0447  BP: 101/63 118/66 132/63 103/66  Pulse: 84  57 68  Temp: 98.4 F (36.9 C)  99.6 F (37.6 C) 97.7 F (36.5 C)  TempSrc: Axillary  Oral Oral  Resp: 18  18 16   Height:      Weight:    66.8 kg (147 lb 4.3 oz)  SpO2: 98%  98% 99%    General: Pt is alert, awake, not in acute distress Cardiovascular: RRR, S1/S2 +, no rubs, no gallops Respiratory: CTA bilaterally, no wheezing, no rhonchi Abdominal: Soft, NT, ND, bowel sounds + Extremities: no edema, no cyanosis    The results of significant diagnostics from this hospitalization (including imaging, microbiology, ancillary and laboratory) are listed below for reference.     Microbiology: Recent Results (from the past 240 hour(s))  Blood Culture (routine x 2)     Status: None (Preliminary result)   Collection Time: 09/09/15  9:07 PM  Result Value Ref Graham Status   Specimen Description BLOOD RIGHT HAND  Final   Special Requests BOTTLES DRAWN AEROBIC AND ANAEROBIC  Final   Culture   Final    NO GROWTH 3 DAYS Performed at [facility]    Report Status PENDING  Incomplete  Urine culture     Status: None   Collection Time: 09/09/15  9:56 PM  Result Value Ref Graham Status   Specimen Description URINE, CATHETERIZED  Final   Special Requests NONE  Final   Culture NO GROWTH Performed at [facility]   Final   Report Status 09/11/2015 FINAL  Final  MRSA PCR Screening     Status: None   Collection Time: 09/10/15 12:59 AM  Result Value Ref Graham Status   MRSA by PCR  NEGATIVE NEGATIVE Final    Comment:        The GeneXpert MRSA Assay (FDA approved for NASAL specimens only), is one component of a comprehensive MRSA colonization surveillance program.  It is not intended to diagnose MRSA infection nor to guide or monitor treatment for MRSA infections.      Labs: BNP (last 3 results) No results for input(s): BNP in the last 8760 hours. Basic Metabolic Panel:  Recent Labs Lab 09/09/15 2108 09/10/15 0111 09/12/15 1027 09/13/15 0413  NA 137 140 138 139  K 3.8 3.4* 3.9 3.7  CL 101 109 103 105  CO2 27 25 25 28   GLUCOSE 115* 122* 115* 122*  BUN 12 11 12 17   CREATININE 0.76 0.61 0.73 0.80  CALCIUM 9.1 8.3* 9.0 9.1   Liver Function Tests:  Recent Labs Lab 09/09/15 2108  AST 26  ALT 13*  ALKPHOS 70  BILITOT 0.5  PROT 7.0  ALBUMIN 3.8    Recent Labs Lab 09/09/15 2108  LIPASE 23   No results for input(s): AMMONIA in the last 168 hours. CBC:  Recent Labs Lab 09/09/15 2108  WBC 10.5  NEUTROABS 7.2  HGB 12.6  HCT 38.5  MCV 86.5  PLT 269   Cardiac Enzymes: No results for input(s): CKTOTAL, CKMB, CKMBINDEX, TROPONINI in the last 168 hours. BNP: Invalid input(s): POCBNP CBG: No results for input(s): GLUCAP in the last 168 hours. D-Dimer No results for input(s): DDIMER in the last 72 hours. Hgb A1c No results for input(s): HGBA1C in the last 72 hours. Lipid Profile No results for input(s): CHOL, HDL, LDLCALC, TRIG, CHOLHDL, LDLDIRECT in the last 72 hours. Thyroid function studies No results for input(s): TSH, T4TOTAL, T3FREE, THYROIDAB in the last 72 hours.  Invalid input(s): FREET3 Anemia work up No results for input(s): VITAMINB12, FOLATE, FERRITIN, TIBC, IRON, RETICCTPCT in the last 72 hours. Urinalysis    Component Value Date/Time   COLORURINE YELLOW 09/09/2015 2156   APPEARANCEUR CLEAR 09/09/2015 2156   LABSPEC 1.008 09/09/2015 2156   PHURINE 7.5 09/09/2015 2156   GLUCOSEU NEGATIVE 09/09/2015 2156   HGBUR  NEGATIVE 09/09/2015 2156   BILIRUBINUR NEGATIVE 09/09/2015 2156   KETONESUR NEGATIVE 09/09/2015 2156   PROTEINUR NEGATIVE 09/09/2015 2156   NITRITE NEGATIVE 09/09/2015 2156   LEUKOCYTESUR NEGATIVE 09/09/2015 2156   Sepsis Labs Invalid input(s): PROCALCITONIN,  WBC,  LACTICIDVEN Microbiology Recent Results (from the past 240 hour(s))  Blood Culture (routine x 2)     Status: None (Preliminary result)   Collection Time: 09/09/15  9:07 PM  Result Value Ref Graham Status   Specimen Description BLOOD RIGHT HAND  Final   Special Requests BOTTLES DRAWN AEROBIC AND ANAEROBIC 3ML  Final   Culture   Final    NO GROWTH 3 DAYS [facility]    Report Status PENDING  Incomplete  Urine culture     Status: None   Collection Time: 09/09/15  9:56 PM  Result Value Ref Graham Status   Specimen Description URINE, CATHETERIZED  Final   Special Requests NONE  Final   Culture NO GROWTH [facility]   Final   Report Status 09/11/2015 FINAL  Final  MRSA PCR Screening     Status: None   Collection Time: 09/10/15 12:59 AM  Result Value Ref Graham Status   MRSA by PCR NEGATIVE NEGATIVE Final    Comment:        The GeneXpert MRSA Assay (FDA approved for NASAL specimens only), is one component of a comprehensive MRSA colonization surveillance program. It is not intended to diagnose MRSA infection nor to guide or monitor treatment for MRSA infections.      Time coordinating  discharge: Over 30 minutes  SIGNED:   Alba Cory, MD  [facility] 09/13/2015, 9:26 AM Pager [phone]  If 7PM-7AM, please contact night-coverage [email or web address] Password [email or web address]

## 2015-09-13 NOTE — Consult Note (Signed)
Consultation Note Date: 09/13/2015   Patient Name: Rhonda Graham  DOB: May 25, 1934  MRN: [id number]  Age / Sex: 81 y.o., female  PCP: Kandyce Rud, MD Referring Physician: Alba Cory, MD  Reason for Consultation: Establishing goals of care  HPI/Patient Profile: 81 y.o. female   admitted on 09/09/2015    Clinical Assessment and Goals of Care:  Rhonda Graham is an unfortunate 81 y.o. female with medical history significant for CJD with precipitous decline over the last several months, atrial fibrillation, hypothyroidism, hypertension, and hyperlipidemia who presented to the ED on 09-09-15 for evaluation of fevers and lethargy. Patient has progressive dementia associated with CJD over the last several months, but in the past 2 weeks she has become completely nonverbal and poorly responsive to those around her. She has continued to eat and drink at times and ambulate on her own to the restroom. She was evaluated by her PCP approximately one week ago, diagnosed with UTI, and started on empiric therapy with ciprofloxacin. Couple of days later, patient's family was notified that organism isolated from the urine culture is resistant to Cipro and she was started on nitrofurantoin. She has been taking nitrofurantoin for the past 3 days but has had persistent fevers, documented 100.9 F at home this evening.  Patient is currently cared for by her daughter at home but they are currently working with her PCP to move her into a skilled nursing facility.   Hospital course significant for A fib with RVR, patient was treated for UTI. She was seen by PT and speech therapy. It appears that the patient is with chronic progressive neurologic decline owing to her diagnosis of CJD, how ever, has had accelerated decline since the past 2 weeks. Palliative care consulted for goals of care.   Patient is awake, resting in chair, she is  essentially non verbal, she does track me in the room, daughter Rhonda Graham is present at the bedside. I introduced myself and palliative care as follows: Palliative medicine is specialized medical care for people living with serious illness. It focuses on providing relief from the symptoms and stress of a serious illness. The goal is to improve quality of life for both the patient and the family.   Discussed about the patient's life limiting illness of CJD. Daughter states patient underwent extensive testing when she was hospitalized at [facility] December 2016. Daughter Rhonda Graham is primary caregiver. She describes her mother as a very stoic independent person who always wanted to be in charge of her own affairs. Patient had a son who is now deceased, she is divorced.   Recommend palliative care follow up with the patient at [facility] on D/C. Patient has brothers and sisters who live in [place], [place] who could come visit with her at [facility]. Briefly introduced concepts specific to a MOST form with the patient's daughter. At present, the only limits to care is DNR. Daughter would want the patient to come back to the hospital, how ever, would want continued palliative follow up to  continue discussions regarding trajectory, prognosis, addition of hospice etc when deemed appropriate.   HCPOA  daughter Rhonda Graham.   SUMMARY OF RECOMMENDATIONS    Agree with DNR Agree with SNF on D/C Recommend palliative services follow up with the patient at SNF [facility].  Briefly introduced MOST form topics with daughter, for now, she wishes for the patient to come back to the hospital if indicated, goals are not "comfort-care only" at this point. Also briefly discussed about how hospice can help, should the patient be deemed hospice eligible in the near future, based on her disease trajectory.   Will flag patient as palliative appropriate in case of future readmission.   Code Status/Advance Care  Planning:  DNR    Symptom Management:  Continue current management, no acute symptoms.   Palliative Prophylaxis:   Bowel Regimen    Psycho-social/Spiritual:   Desire for further Chaplaincy support:no  Additional Recommendations: Caregiving  Support/Resources  Prognosis:   Unable to determine but guarded.   Discharge Planning: Skilled Nursing Facility for rehab:recommend Palliative care service follow-up      Primary Diagnoses: Present on Admission:  . Fever . Paroxysmal atrial fibrillation (HCC) . Essential hypertension . HLD (hyperlipidemia) . CJD (Creutzfeldt-Jakob disease) . Atrial fibrillation with rapid ventricular response (HCC) . Hypothyroid . Atrial fibrillation with RVR (HCC) . Malnutrition of moderate degree  I have reviewed the medical record, interviewed the patient and family, and examined the patient. The following aspects are pertinent.  Past Medical History  Diagnosis Date  . Paroxysmal atrial fibrillation (HCC)   . Sinus bradycardia   . Hypothyroid   . Coronary artery disease     prior cath without blockages per pt  . Hyperlipidemia   . Hypertension   . Stroke (HCC) 09/2008    residual balance deficit  . Hypotension   . Elevated troponin     a. 08/2014 post AF ablation.  . Obesity   . Atrial flutter (HCC)     a. Multiple atypical atrial flutter circuits, too unstable for mapping/ ablation by EP study 08/21/14.  . Atrial tachycardia (HCC)     a. Atach identified on event monitoring per Dr. Jenel Lucks note.  . Hyperglycemia   . Elevated serum creatinine     a. Per labs 08/2014.  Marland Kitchen CJD (Creutzfeldt-Jakob disease)   . Diverticulitis   . Complication of anesthesia     one time low bp & difficulty waking    Social History   Social History  . Marital Status: Single    Spouse Name: N/A  . Number of Children: N/A  . Years of Education: N/A   Social History Main Topics  . Smoking status: Never Smoker   . Smokeless tobacco: Never Used  .  Alcohol Use: No  . Drug Use: No  . Sexual Activity: Not Asked   Other Topics Concern  . None   Social History Narrative   Pt lives in [place] alone.  Divorced   Retired from Warehouse manager work   Family History  Problem Relation Age of Onset  . Stroke Mother   . Heart disease Mother   . Heart disease Father   . Stroke Father   . Seizures Sister   . Parkinsonism Brother   . Breast cancer Neg Hx    Scheduled Meds: . aspirin  81 mg Oral Daily  . atorvastatin  10 mg Oral q1800  . cefTRIAXone (ROCEPHIN)  IV  1 g Intravenous Q24H  . diltiazem  30 mg Oral Q6H  .  enoxaparin (LOVENOX) injection  40 mg Subcutaneous Q24H  . feeding supplement (ENSURE ENLIVE)  237 mL Oral Q24H  . levothyroxine  125 mcg Oral QAC breakfast  . metoprolol  50 mg Oral BID  . QUEtiapine  50 mg Oral QPM   Continuous Infusions:  PRN Meds:.acetaminophen, ALPRAZolam, ondansetron (ZOFRAN) IV Medications Prior to Admission:  Prior to Admission medications   Medication Sig Start Date End Date Taking? Authorizing Provider  ALPRAZolam (XANAX) 0.5 MG tablet TK 1 T PO Q NIGHT PRF SLEEP 09/04/15  Yes Historical Provider, MD  aspirin EC 325 MG tablet Take 325 mg by mouth daily.    Yes Historical Provider, MD  diltiazem (CARDIZEM CD) 120 MG 24 hr capsule Take 120 mg by mouth daily.  08/12/15 08/11/16 Yes Historical Provider, MD  levothyroxine (SYNTHROID, LEVOTHROID) 125 MCG tablet TK 1 T PO QD 08/16/15  Yes Historical Provider, MD  Melatonin 1 MG/ML LIQD Take 1.5 mLs by mouth at bedtime.    Yes Historical Provider, MD  metoprolol (LOPRESSOR) 50 MG tablet Take 1 tablet (50 mg total) by mouth 2 (two) times daily. 07/20/15  Yes Rolly Salter, MD  metoprolol succinate (TOPROL-XL) 25 MG 24 hr tablet Take 12.5 mg by mouth daily.  08/12/15  Yes Historical Provider, MD  nitrofurantoin, macrocrystal-monohydrate, (MACROBID) 100 MG capsule Take 100 mg by mouth 2 (two) times daily.  09/06/15  Yes Historical Provider, MD  QUEtiapine (SEROQUEL) 50 MG  tablet Take 50 mg by mouth every evening.   Yes Historical Provider, MD  simvastatin (ZOCOR) 20 MG tablet Take 20 mg by mouth daily at 6 PM.    Yes Historical Provider, MD  ALPRAZolam (XANAX) 0.5 MG tablet Take 1 tablet (0.5 mg total) by mouth 3 (three) times daily as needed for anxiety or sleep. 09/13/15   Belkys A Regalado, MD  atorvastatin (LIPITOR) 10 MG tablet Take 1 tablet (10 mg total) by mouth daily at 6 PM. 09/13/15   Belkys A Regalado, MD  diltiazem (CARDIZEM) 30 MG tablet Take 1 tablet (30 mg total) by mouth every 6 (six) hours. 09/13/15   Belkys A Regalado, MD  feeding supplement, ENSURE ENLIVE, (ENSURE ENLIVE) LIQD Take 237 mLs by mouth daily. 09/13/15   Belkys A Regalado, MD  furosemide (LASIX) 20 MG tablet Take 1 tablet (20 mg total) by mouth daily. Patient not taking: Reported on 09/09/2015 07/24/15   Geoffery Lyons, MD  levothyroxine (SYNTHROID, LEVOTHROID) 100 MCG tablet Take 1 tablet (100 mcg total) by mouth daily before breakfast. Patient not taking: Reported on 09/09/2015 07/20/15   Rolly Salter, MD  levothyroxine (SYNTHROID, LEVOTHROID) 125 MCG tablet Take 1 tablet (125 mcg total) by mouth daily before breakfast. 09/13/15   Belkys A Regalado, MD  nystatin cream (MYCOSTATIN) Apply 1 application topically 2 (two) times daily. Bid to to buttock crease perianal and groin for fungal rash    Historical Provider, MD   No Known Allergies Review of Systems Non verbal, in no distress.   Physical Exam Elderly lady, mute, does track me in the room, does not verbalize S1 S2 Clear Abdomen soft No edema Awake, alert, recognizes daughter present in the room.   Vital Signs: BP 115/68 mmHg  Pulse 72  Temp(Src) 97.7 F (36.5 C) (Oral)  Resp 16  Ht 5\' 4"  (1.626 m)  Wt 66.8 kg (147 lb 4.3 oz)  BMI 25.27 kg/m2  SpO2 99% Pain Assessment: PAINAD   Pain Score: 0-No pain   SpO2: SpO2: 99 % O2  Device:SpO2: 99 % O2 Flow Rate: .   IO: Intake/output summary:  Intake/Output Summary (Last 24  hours) at 09/13/15 1450 Last data filed at 09/12/15 1900  Gross per 24 hour  Intake    100 ml  Output      0 ml  Net    100 ml    LBM: Last BM Date: 09/11/15 Baseline Weight: Weight: 70.3 kg (154 lb 15.7 oz) Most recent weight: Weight: 66.8 kg (147 lb 4.3 oz)     Palliative Assessment/Data:   Flowsheet Rows        Most Recent Value   Intake Tab    Referral Department  Hospitalist   Unit at Time of Referral  Med/Surg Unit   Palliative Care Primary Diagnosis  Neurology   Date Notified  09/13/15   Palliative Care Type  New Palliative care   Reason for referral  Clarify Goals of Care   Date of Admission  09/09/15   Date first seen by Palliative Care  09/13/15   # of days IP prior to Palliative referral  4   Clinical Assessment    Palliative Performance Scale Score  30%   Pain Max last 24 hours  0   Pain Min Last 24 hours  0   Psychosocial & Spiritual Assessment    Palliative Care Outcomes    Patient/Family meeting held?  Yes   Who was at the meeting?  daughter Rhonda DavenportSandra.    Palliative Care follow-up planned  Yes, Facility      Time In: 1340 Time Out: 1440 Time Total: 60 min  Greater than 50%  of this time was spent counseling and coordinating care related to the above assessment and plan.  Signed by: Rosalin HawkingZeba Savior Himebaugh, MD  [phone]  Please contact Palliative Medicine Team phone at [phone] for questions and concerns.  For individual provider: See Loretha StaplerAmion

## 2015-09-13 NOTE — Progress Notes (Addendum)
8:07am LCSW following for placement: SNF. Bed offer: [facility]. Barrier to DC:  Awaiting [employer] Medicare auth  (which was submitted on Friday and still in process). Will send most recent PT note to facility as PT worked with patient this morning. (completed)  Aware of patient being discharged and hopeful for insurance auth today.   All discharge information sent to facility. Will continue to follow.  2:33 PM Still pending insurance authorization All documents have been completed and faxed. [facility], he will be checking and followed up. RN and family made aware of barrier to DC.  4:42 PM Holding discharge for today due to unsafe plan. No authorization for insurance for SNF. Doug with [facility] is actively working on [place]. Will have CSW follow up first thing in AM. Discussed option of patient privately, but not an option.  Daughter willing to try and pay, but does not feel she has enough. Patient had HH prior to admission.  Was not happy with home health agency.  Does not have appropriate equipment at home to help care for patient safely. If denied by insurance, will have to go home and daughter is aware. CM aware of possible home health need.    Deretha EmoryHannah Divit Stipp LCSW, MSW Clinical Social Work: [facility] [employer] [employer] [phone]

## 2015-09-14 LAB — CULTURE, BLOOD (ROUTINE X 2): Culture: NO GROWTH

## 2015-09-14 LAB — T3, FREE: T3 FREE: 1.7 pg/mL — AB (ref 2.0–4.4)

## 2015-09-14 NOTE — Clinical Social Work Placement (Signed)
Patient is set to discharge [facility] today. Patient & daughter, Rhonda [place] made aware. Discharge packet given to RN, Cala BradfordKimberly. PTAR called for transport.     Rhonda MaxinKelly Emmah Bratcher, LCSW [facility] Clinical Social Worker cell #: [phone]    CLINICAL SOCIAL WORK PLACEMENT  NOTE  Date:  09/14/2015  Patient Details  Name: Rhonda Graham MRN: [id number] Date of Birth: 02/25/1935  Clinical Social Work is seeking post-discharge placement for this patient at the Skilled  Nursing Facility level of care (*CSW will initial, date and re-position this form in  chart as items are completed):  Yes   Patient/family provided with [place] Clinical Social Work Department's list of facilities offering this level of care within the geographic area requested by the patient (or if unable, by the patient's family).  Yes   Patient/family informed of their freedom to choose among providers that offer the needed level of care, that participate in Medicare, Medicaid or managed care program needed by the patient, have an available bed and are willing to accept the patient.  Yes   Patient/family informed of [facility]'s ownership interest in [facility] and [facility], as well as of the fact that they are under no obligation to receive care at these facilities.  PASRR submitted to EDS on       PASRR number received on       Existing PASRR number confirmed on 09/10/15     FL2 transmitted to all facilities in geographic area requested by pt/family on 09/10/15     FL2 transmitted to all facilities within larger geographic area on       Patient informed that his/her managed care company has contracts with or will negotiate with certain facilities, including the following:        Yes   Patient/family informed of bed offers received.  [facility]     Physician recommends and patient chooses bed at      Patient to be transferred to  [facility] on 09/14/15.  Patient to be transferred to facility by PTAR     Patient family notified on 09/14/15 of transfer.  Name of family member notified:  patient's daughter, Rhonda Graham via phone     PHYSICIAN       Additional Comment:    _______________________________________________ Arlyss RepressHarrison, Kourtni Stineman F, LCSW 09/14/2015, 3:04 PM

## 2015-09-14 NOTE — Progress Notes (Signed)
Report called to [facility] [phone], IV and telemetry box removed, PTAR to transport to facility

## 2015-09-14 NOTE — Discharge Summary (Signed)
Physician Discharge Summary  Rhonda Graham:[id number] DOB: 1934/05/20 DOA: 09/09/2015  PCP: Rozanna Box, MD  Admit date: 09/09/2015 Discharge date: 09/14/2015  Admitted From: Home Disposition: SNF  Recommendations for Outpatient Follow-up:  1. Follow up with PCP in 1-2 weeks 2. Please obtain BMP/CBC in one week 3. Please follow up on the following pending results: Free T 3 adjust synthroid as needed.  4. Needs to follow up with cardiology for further recommendation anticoagulation.  5. Needs Palliative care follow up at facility     Discharge Condition: Stable.  CODE STATUS: DNR Diet recommendation:  Dysphagia 3 diet   Brief/Interim Summary: Brief Narrative: Rhonda Graham is an unfortunate 81 y.o. female with medical history significant for CJD with precipitous decline over the last several months, atrial fibrillation, hypothyroidism, hypertension, and hyperlipidemia who presents to the ED for evaluation of fevers and lethargy. Patient is had progressive dementia associated with CJD over the last several months, but in the past 2 weeks she has become completely nonverbal and poorly responsive to those around her. She has continued to eat and drink at times and ambulate on her own to the restroom. She was evaluated by her PCP approximately one week ago, diagnosed with UTI, and started on empiric therapy with ciprofloxacin. Couple of days later, patient's family was notified that organism isolated from the urine culture is resistant to Cipro and she was started on nitrofurantoin. She has been taking nitrofurantoin for the past 3 days but has had persistent fevers, documented 100.9 F at home this evening. She has not voiced any complaints, has not been noted to cough or vomit, and has not had diarrhea. There had been no wounds or sores identified. Patient is currently cared for by her daughter at home but they are currently working with her PCP to move her into a skilled nursing facility.     Assessment & Plan:  Principal Problem:  Atrial fibrillation with rapid ventricular response (HCC) Active Problems:  Paroxysmal atrial fibrillation (HCC)  Essential hypertension  Hypothyroid  HLD (hyperlipidemia)  CJD (Creutzfeldt-Jakob disease)  Fever  Atrial fibrillation with RVR (HCC)  Malnutrition of moderate degree  1. Atrial fibrillation with RVR  - RVR resolved on diltiazem infusion, now on oral Cardizem.  - CHADS-VASc is at least 102 (age x2, gender, HTN); per notes, decision was made not to anticoagulate given high fall-risk , advised daughter to follow up with primary cardiologist  - Managed with daily ASA 325 and Toprol, will continue - Monitor on telemetry   2. Fever  - Temp 38.7 F rectally on admission  - CXR with no apparent infiltrate and UA not suggestive of infection (though is under treatment with Macrobid at time of admission); no apparent wounds  - Blood culture no growth to date. and urine cultures no growth but was on nitrofurantoin  - received ceftriaxone for 3 days. Finished treatment for UTI. Repeated urine culture no growth to date.   3. CJD  - Diagnosed in December 2016 and progressing as expected  - Associated agitation has been managed with prn Ativan and Seroquel qHS, will continue  -on dysphagia 3 diet  -needs palliative care consult   4. Hypothyroidism  - TSH low  - Continue current-dose Synthroid for now  - free T 3 pending , Free T 4 normal.   5. Hypertension  - Managed with Toprol XL at home, will continue   6. Hyperlipidemia  - Continue current management with lipitor. Avoid simvastatin , patient  on Cardizem.    Discharge Diagnoses:    Atrial fibrillation with rapid ventricular response (HCC)   UTI   Paroxysmal atrial fibrillation (HCC)   Essential hypertension   Hypothyroid   HLD (hyperlipidemia)   CJD (Creutzfeldt-Jakob disease)   Fever   Atrial fibrillation with RVR (HCC)   Malnutrition of  moderate degree    Discharge Instructions      Discharge Instructions    Diet - low sodium heart healthy    Complete by:  As directed      Diet - low sodium heart healthy    Complete by:  As directed      Increase activity slowly    Complete by:  As directed      Increase activity slowly    Complete by:  As directed             Medication List    STOP taking these medications        diltiazem 120 MG 24 hr capsule  Commonly known as:  CARDIZEM CD     furosemide 20 MG tablet  Commonly known as:  LASIX     metoprolol succinate 25 MG 24 hr tablet  Commonly known as:  TOPROL-XL     nitrofurantoin (macrocrystal-monohydrate) 100 MG capsule  Commonly known as:  MACROBID     simvastatin 20 MG tablet  Commonly known as:  ZOCOR      TAKE these medications        ALPRAZolam 0.5 MG tablet  Commonly known as:  XANAX  Take 1 tablet (0.5 mg total) by mouth 3 (three) times daily as needed for anxiety or sleep.     aspirin EC 325 MG tablet  Take 325 mg by mouth daily.     atorvastatin 10 MG tablet  Commonly known as:  LIPITOR  Take 1 tablet (10 mg total) by mouth daily at 6 PM.     diltiazem 30 MG tablet  Commonly known as:  CARDIZEM  Take 1 tablet (30 mg total) by mouth every 6 (six) hours.     feeding supplement (ENSURE ENLIVE) Liqd  Take 237 mLs by mouth daily.     levothyroxine 125 MCG tablet  Commonly known as:  SYNTHROID, LEVOTHROID  Take 1 tablet (125 mcg total) by mouth daily before breakfast.     Melatonin 1 MG/ML Liqd  Take 1.5 mLs by mouth at bedtime.     metoprolol 50 MG tablet  Commonly known as:  LOPRESSOR  Take 1 tablet (50 mg total) by mouth 2 (two) times daily.     nystatin cream  Commonly known as:  MYCOSTATIN  Apply 1 application topically 2 (two) times daily. Bid to to buttock crease perianal and groin for fungal rash     QUEtiapine 50 MG tablet  Commonly known as:  SEROQUEL  Take 50 mg by mouth every evening.       Follow-up  Information    Follow up with BABAOFF, [name], MD In 1 week.   Specialty:  Family Medicine   Contact information:   [address] [facility] [place] [address] [phone]       Follow up with Hillis RangeJames Allred, MD In 1 week.   Specialty:  Cardiology   Contact information:   [address] [place] [address] [phone]      No Known Allergies  Consultations:  none   Procedures/Studies: Ct Abdomen Pelvis W Contrast  09/09/2015  CLINICAL DATA:  Abdominal pain and fever.  UTI. EXAM: CT ABDOMEN AND PELVIS WITH CONTRAST TECHNIQUE: Multidetector CT imaging of the abdomen and pelvis was performed using the standard protocol following bolus administration of intravenous contrast. CONTRAST:  ISOVUE-300 IOPAMIDOL (ISOVUE-300) INJECTION 61% COMPARISON:  07/15/2015 FINDINGS: Lower chest and abdominal wall:  Biatrial enlargement. Mild dependent atelectasis, also seen previously Hepatobiliary: No focal liver abnormality.No evidence of biliary obstruction or stone. Pancreas: Unremarkable. Spleen: Benign-appearing 1 cm low-density inferiorly, stable. Adrenals/Urinary Tract: Negative adrenals. No hydronephrosis or stone. No visible pyelonephritis. Unremarkable bladder. Reproductive:Hysterectomy with negative adnexae. Stomach/Bowel: No obstruction. Distal colonic diverticulosis without active inflammation. No appendicitis. Vascular/Lymphatic: No acute vascular abnormality. No mass or adenopathy. Peritoneal: No ascites or pneumoperitoneum. Musculoskeletal: Remote L1 compression fracture with moderate height loss and mild retropulsion. No acute osseous finding. IMPRESSION: 1. No acute finding or explanation for fever. 2. Chronic findings are stable from prior and described above. Electronically Signed   By: Marnee Spring M.D.   On: 09/09/2015 23:26   Dg Chest Port 1 View  09/09/2015  CLINICAL DATA:  Left rib tenderness in shortness of breath  EXAM: PORTABLE CHEST 1 VIEW COMPARISON:  07/23/2015 FINDINGS: The heart size and mediastinal contours are within normal limits. Both lungs are clear. The visualized skeletal structures are unremarkable. IMPRESSION: No active disease. Electronically Signed   By: Elige Ko   On: 09/09/2015 21:31      Subjective: Denies pain, say few words but don't make sense.   Discharge Exam: Filed Vitals:   09/13/15 2120 09/14/15 0655  BP: 123/61 110/64  Pulse: 84 77  Temp: 98.8 F (37.1 C) 97.5 F (36.4 C)  Resp: 16 16   Filed Vitals:   09/13/15 0447 09/13/15 1033 09/13/15 2120 09/14/15 0655  BP: 103/66 115/68 123/61 110/64  Pulse: 68 72 84 77  Temp: 97.7 F (36.5 C) 98.2 F (36.8 C) 98.8 F (37.1 C) 97.5 F (36.4 C)  TempSrc: Oral Oral Oral Axillary  Resp: 16 18 16 16   Height:      Weight: 66.8 kg (147 lb 4.3 oz)   67.3 kg (148 lb 5.9 oz)  SpO2: 99% 98% 100% 100%    General: Pt is alert, awake, not in acute distress Cardiovascular: RRR, S1/S2 +, no rubs, no gallops Respiratory: CTA bilaterally, no wheezing, no rhonchi Abdominal: Soft, NT, ND, bowel sounds + Extremities: no edema, no cyanosis    The results of significant diagnostics from this hospitalization (including imaging, microbiology, ancillary and laboratory) are listed below for reference.     Microbiology: Recent Results (from the past 240 hour(s))  Blood Culture (routine x 2)     Status: None (Preliminary result)   Collection Time: 09/09/15  9:07 PM  Result Value Ref Range Status   Specimen Description BLOOD RIGHT HAND  Final   Special Requests BOTTLES DRAWN AEROBIC AND ANAEROBIC  Final   Culture   Final    NO GROWTH 4 DAYS Performed at [facility]    Report Status PENDING  Incomplete  Urine culture     Status: None   Collection Time: 09/09/15  9:56 PM  Result Value Ref Range Status   Specimen Description URINE, CATHETERIZED  Final   Special Requests NONE  Final   Culture NO GROWTH Performed  at [facility]   Final   Report Status 09/11/2015 FINAL  Final  MRSA PCR Screening     Status: None   Collection Time: 09/10/15 12:59  AM  Result Value Ref Range Status   MRSA by PCR NEGATIVE NEGATIVE Final    Comment:        The GeneXpert MRSA Assay (FDA approved for NASAL specimens only), is one component of a comprehensive MRSA colonization surveillance program. It is not intended to diagnose MRSA infection nor to guide or monitor treatment for MRSA infections.      Labs: BNP (last 3 results) No results for input(s): BNP in the last 8760 hours. Basic Metabolic Panel:  Recent Labs Lab 09/09/15 2108 09/10/15 0111 09/12/15 1027 09/13/15 0413  NA 137 140 138 139  K 3.8 3.4* 3.9 3.7  CL 101 109 103 105  CO2 GLUCOSE 115* 122* 115* 122*  BUN CREATININE 0.76 0.61 0.73 0.80  CALCIUM 9.1 8.3* 9.0 9.1   Liver Function Tests:  Recent Labs Lab 09/09/15 2108  AST 26  ALT 13*  ALKPHOS 70  BILITOT 0.5  PROT 7.0  ALBUMIN 3.8    Recent Labs Lab 09/09/15 2108  LIPASE 23   No results for input(s): AMMONIA in the last 168 hours. CBC:  Recent Labs Lab 09/09/15 2108  WBC 10.5  NEUTROABS 7.2  HGB 12.6  HCT 38.5  MCV 86.5  PLT 269   Cardiac Enzymes: No results for input(s): CKTOTAL, CKMB, CKMBINDEX, TROPONINI in the last 168 hours. BNP: Invalid input(s): POCBNP CBG: No results for input(s): GLUCAP in the last 168 hours. D-Dimer No results for input(s): DDIMER in the last 72 hours. Hgb A1c No results for input(s): HGBA1C in the last 72 hours. Lipid Profile No results for input(s): CHOL, HDL, LDLCALC, TRIG, CHOLHDL, LDLDIRECT in the last 72 hours. Thyroid function studies No results for input(s): TSH, T4TOTAL, T3FREE, THYROIDAB in the last 72 hours.  Invalid input(s): FREET3 Anemia work up No results for input(s): VITAMINB12, FOLATE, FERRITIN, TIBC, IRON, RETICCTPCT in the last 72 hours. Urinalysis    Component Value  Date/Time   COLORURINE YELLOW 09/09/2015 2156   APPEARANCEUR CLEAR 09/09/2015 2156   LABSPEC 1.008 09/09/2015 2156   PHURINE 7.5 09/09/2015 2156   GLUCOSEU NEGATIVE 09/09/2015 2156   HGBUR NEGATIVE 09/09/2015 2156   BILIRUBINUR NEGATIVE 09/09/2015 2156   KETONESUR NEGATIVE 09/09/2015 2156   PROTEINUR NEGATIVE 09/09/2015 2156   NITRITE NEGATIVE 09/09/2015 2156   LEUKOCYTESUR NEGATIVE 09/09/2015 2156   Sepsis Labs Invalid input(s): PROCALCITONIN,  WBC,  LACTICIDVEN Microbiology Recent Results (from the past 240 hour(s))  Blood Culture (routine x 2)     Status: None (Preliminary result)   Collection Time: 09/09/15  9:07 PM  Result Value Ref Range Status   Specimen Description BLOOD RIGHT HAND  Final   Special Requests BOTTLES DRAWN AEROBIC AND ANAEROBIC  Final   Culture   Final    NO GROWTH 4 DAYS Performed at [facility]    Report Status PENDING  Incomplete  Urine culture     Status: None   Collection Time: 09/09/15  9:56 PM  Result Value Ref Range Status   Specimen Description URINE, CATHETERIZED  Final   Special Requests NONE  Final   Culture NO GROWTH Performed at [facility]   Final   Report Status 09/11/2015 FINAL  Final  MRSA PCR Screening     Status: None   Collection Time: 09/10/15 12:59 AM  Result Value Ref Range Status   MRSA by PCR NEGATIVE NEGATIVE Final    Comment:  The GeneXpert MRSA Assay (FDA approved for NASAL specimens only), is one component of a comprehensive MRSA colonization surveillance program. It is not intended to diagnose MRSA infection nor to guide or monitor treatment for MRSA infections.      Time coordinating discharge: Over 30 minutes  SIGNED:   Alba Cory, MD  [facility] 09/14/2015, 12:51 PM Pager [phone]  If 7PM-7AM, please contact night-coverage [email or web address] Password [email or web address]

## 2015-09-14 NOTE — Progress Notes (Signed)
Daily Progress Note   Patient Name: Rhonda Graham       Date: 09/14/2015 DOB: 12/26/1934  Age: 81 y.o. MRN#: [id number] Attending Physician: Alba Cory, MD Primary Care Physician: Rozanna Box, MD Admit Date: 09/09/2015  Reason for Consultation/Follow-up: Establishing goals of care  Subjective:  awake alert Daughter not at bedside Resting in bed She is calm, keeps saying, "it makes it worse" Tracks me in the room, engages in eye contact, but has word finding difficulty, word paucity, answers non meaningfully.   Daughter called me in evening on 6-12 asking for more information about home with hospice as a back up plan in case patient is not able to go to SNF. Discussed with her in detail. Case manager consulted to notify hospice agency today so daughter may have more information regarding how much help home hospice care can provide.    Length of Stay: 2  Current Medications: Scheduled Meds:  . aspirin  81 mg Oral Daily  . atorvastatin  10 mg Oral q1800  . cefTRIAXone (ROCEPHIN)  IV  1 g Intravenous Q24H  . diltiazem  30 mg Oral Q6H  . enoxaparin (LOVENOX) injection  40 mg Subcutaneous Q24H  . feeding supplement (ENSURE ENLIVE)  237 mL Oral Q24H  . levothyroxine  125 mcg Oral QAC breakfast  . metoprolol  50 mg Oral BID  . QUEtiapine  50 mg Oral QPM    Continuous Infusions:    PRN Meds: acetaminophen, ALPRAZolam, ondansetron (ZOFRAN) IV  Physical Exam         Awake alert Resting in bed Verbalizes some but not meaningfully S1 S2 Clear No edema Abdomen soft  Vital Signs: BP 110/64 mmHg  Pulse 77  Temp(Src) 97.5 F (36.4 C) (Axillary)  Resp 16  Ht 5\' 4"  (1.626 m)  Wt 67.3 kg (148 lb 5.9 oz)  BMI 25.46 kg/m2  SpO2 100% SpO2: SpO2: 100 % O2 Device: O2  Device: Not Delivered O2 Flow Rate:    Intake/output summary:  Intake/Output Summary (Last 24 hours) at 09/14/15 0808 Last data filed at 09/13/15 1900  Gross per 24 hour  Intake    590 ml  Output      0 ml  Net    590 ml   LBM: Last BM Date: 09/11/15 Baseline Weight: Weight: 70.3  kg (154 lb 15.7 oz) Most recent weight: Weight: 67.3 kg (148 lb 5.9 oz)       Palliative Assessment/Data:    Flowsheet Rows        Most Recent Value   Intake Tab    Referral Department  Hospitalist   Unit at Time of Referral  Med/Surg Unit   Palliative Care Primary Diagnosis  Neurology   Date Notified  09/13/15   Palliative Care Type  New Palliative care   Reason for referral  Clarify Goals of Care   Date of Admission  09/09/15   Date first seen by Palliative Care  09/13/15   # of days IP prior to Palliative referral  4   Clinical Assessment    Palliative Performance Scale Score  40%   Pain Max last 24 hours  0   Pain Min Last 24 hours  0   Psychosocial & Spiritual Assessment    Palliative Care Outcomes    Patient/Family meeting held?  Yes   Who was at the meeting?  daughter Dois DavenportSandra.    Palliative Care follow-up planned  Yes, Facility      Patient Active Problem List   Diagnosis Date Noted  . Encounter for palliative care   . Goals of care, counseling/discussion   . Atrial fibrillation with RVR (HCC) 09/10/2015  . Malnutrition of moderate degree 09/10/2015  . Fever 09/09/2015  . CKD (chronic kidney disease) stage 3, GFR 30-59 ml/min 07/16/2015  . Iatrogenic hyperthyroidism 07/16/2015  . CJD (Creutzfeldt-Jakob disease) 07/16/2015  . Acute encephalopathy   . Diarrhea   . Atrial fibrillation with rapid ventricular response (HCC) 07/15/2015  . Diverticulitis 07/15/2015  . Hypothyroid 07/15/2015  . HLD (hyperlipidemia) 07/15/2015  . Postprocedural hypotension   . Elevated troponin   . A-fib (HCC) 08/21/2014  . Paroxysmal atrial fibrillation (HCC) 07/29/2014  . Morbid obesity (HCC)  07/29/2014  . Essential hypertension 07/29/2014    Palliative Care Assessment & Plan   Patient Profile:    Assessment:  dementia, gradual progressive neuro degenerative decline, CJD A fib, ?UTI s/p treatment.   Recommendations/Plan:   recommend hospice consult, daughter Dois DavenportSandra asking for more information about hospice care at home. Case manager input requested.   Continue current treatments.     Code Status:    Code Status Orders        Start     Ordered   09/10/15 0018  Do not attempt resuscitation (DNR)   Continuous    Question Answer Comment  In the event of cardiac or respiratory ARREST Do not call a "code blue"   In the event of cardiac or respiratory ARREST Do not perform Intubation, CPR, defibrillation or ACLS   In the event of cardiac or respiratory ARREST Use medication by any route, position, wound care, and other measures to relive pain and suffering. May use oxygen, suction and manual treatment of airway obstruction as needed for comfort.      09/10/15 0027    Code Status History    Date Active Date Inactive Code Status Order ID Comments User Context   07/18/2015 12:52 PM 07/20/2015  8:14 PM DNR [id number]  Rolly SalterPranav M Patel, MD Inpatient   07/15/2015  5:11 PM 07/18/2015 12:52 PM Partial Code [id number]  Haydee SalterPhillip M Hobbs, MD Inpatient   07/15/2015  2:12 PM 07/15/2015  5:11 PM Full Code [id number]  Haydee SalterPhillip M Hobbs, MD Inpatient   08/21/2014  4:48 PM 08/23/2014  6:16 PM Full Code [id number]  Hillis RangeJames Allred, MD  Inpatient       Prognosis:   guarded, ? Less than 6 months, given patient's subacute decline in the past 2-3 weeks, gradual decline since January 2017.   Discharge Planning:  Home with Hospice is being considered as a backup plan by daughter while she awaits decision from insurance for [employer].   Care plan was discussed with patient's daughter [name] on 6-12.  Discussed with CSW this am.   Thank you for allowing the Palliative Medicine Team to assist in  the care of this patient.   Time In:  730 Time Out: 8 Total Time 30 Prolonged Time Billed  no       Greater than 50%  of this time was spent counseling and coordinating care related to the above assessment and plan.  Rosalin Hawking, MD [phone]  Please contact Palliative Medicine Team phone at [phone] for questions and concerns.

## 2015-12-03 DEATH — deceased

## 2016-07-18 IMAGING — CR DG ABD PORTABLE 1V
1 series · 1 of 1 positions shown · non-contrast
Comparison: CT abdomen and pelvis July 15, 2015

CLINICAL DATA: Diarrhea with mild hematochezia.  Pain

EXAM:
PORTABLE ABDOMEN - 1 VIEW

[AP]
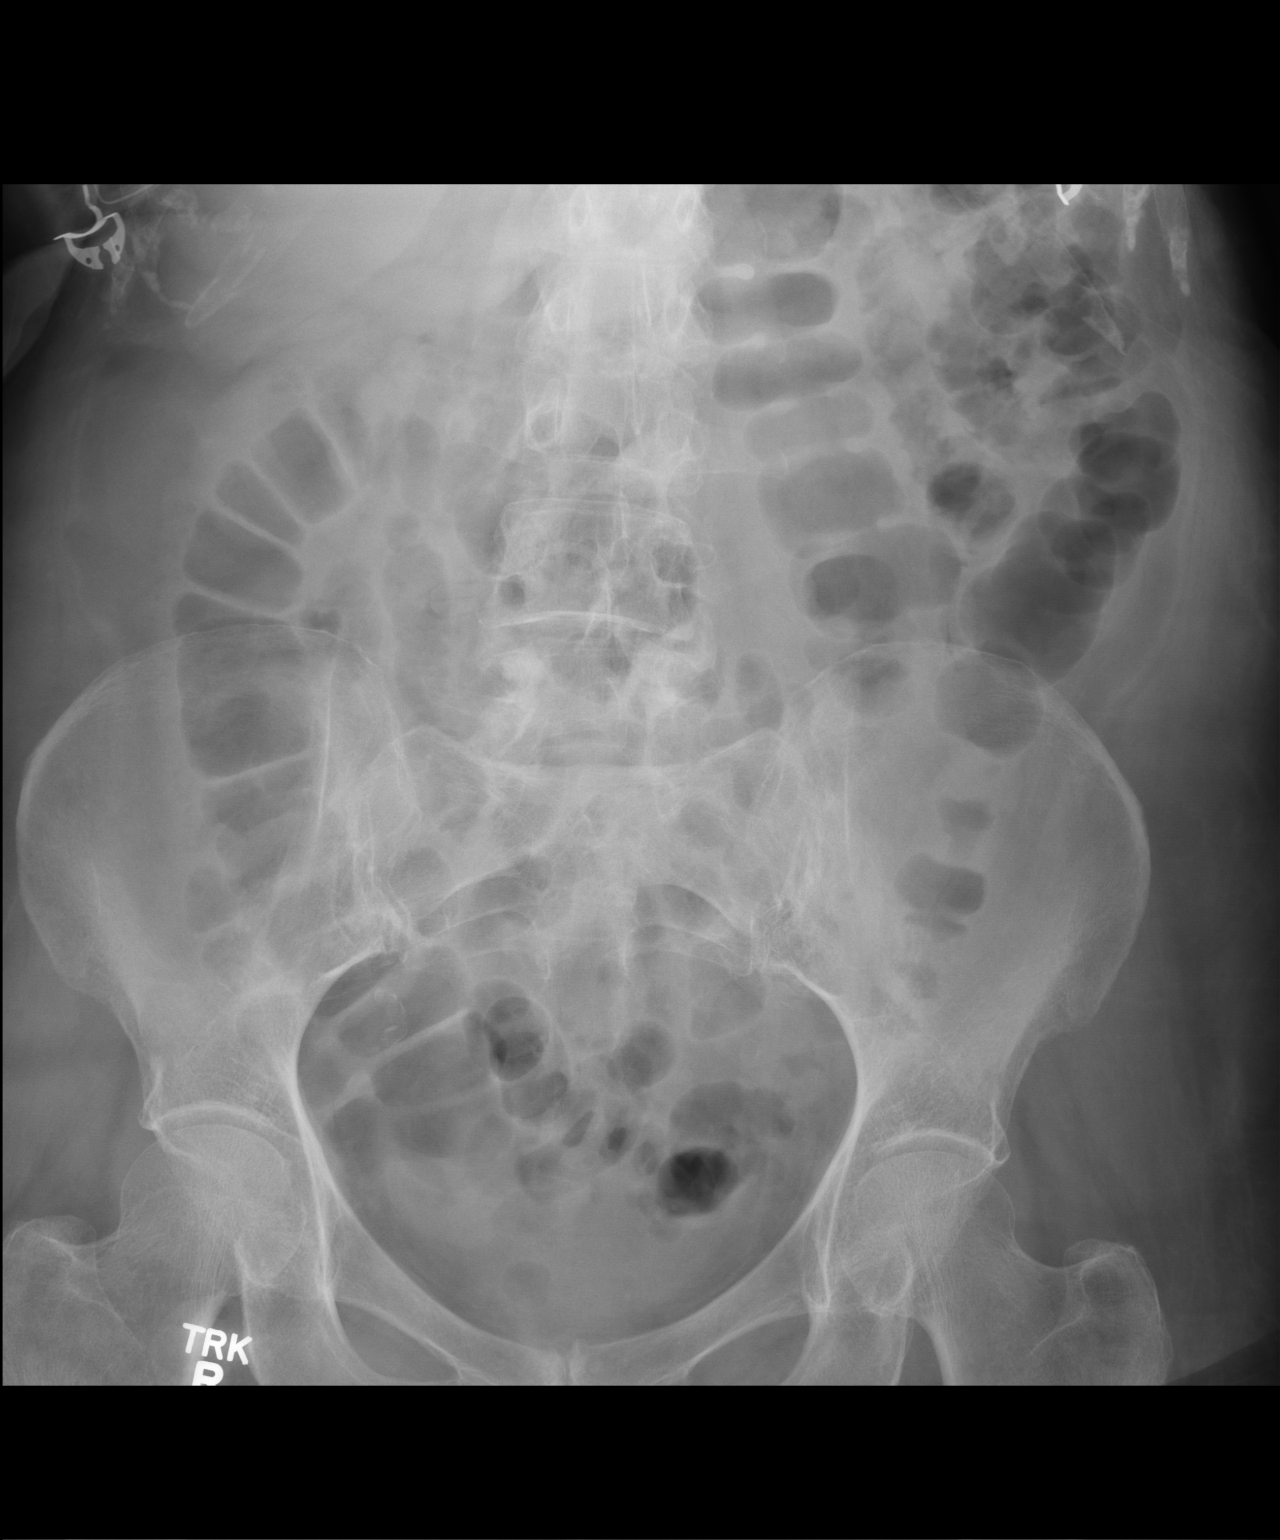

[1 of 1 positions shown; findings below may reference images not displayed]

FINDINGS: There is no bowel dilatation or air-fluid level suggesting
obstruction. No free air. There are foci of vascular calcification
in the right pelvis. There is degenerative change in the pubic
symphysis region.
IMPRESSION: Bowel gas pattern unremarkable. No demonstrable obstruction or free
air.
